# Patient Record
Sex: Male | Born: 1975 | Race: White | Hispanic: No | Marital: Married | Smoking: Former smoker
Health system: Southern US, Community
[De-identification: ages and names within clinical notes are randomized; demographics above are authoritative.]

## PROBLEM LIST (undated history)

## (undated) DIAGNOSIS — J189 Pneumonia, unspecified organism: Secondary | ICD-10-CM

## (undated) DIAGNOSIS — K219 Gastro-esophageal reflux disease without esophagitis: Secondary | ICD-10-CM

## (undated) DIAGNOSIS — N200 Calculus of kidney: Secondary | ICD-10-CM

## (undated) DIAGNOSIS — G4730 Sleep apnea, unspecified: Secondary | ICD-10-CM

## (undated) HISTORY — DX: Sleep apnea, unspecified: G47.30

## (undated) HISTORY — DX: Gastro-esophageal reflux disease without esophagitis: K21.9

## (undated) HISTORY — PX: INGUINAL HERNIA REPAIR: SUR1180

## (undated) HISTORY — DX: Calculus of kidney: N20.0

## (undated) HISTORY — DX: Pneumonia, unspecified organism: J18.9

---

## 1997-12-29 ENCOUNTER — Emergency Department (HOSPITAL_COMMUNITY): Admission: EM | Admit: 1997-12-29 | Discharge: 1997-12-29 | Payer: Self-pay | Admitting: Emergency Medicine

## 1998-07-04 ENCOUNTER — Emergency Department (HOSPITAL_COMMUNITY): Admission: EM | Admit: 1998-07-04 | Discharge: 1998-07-04 | Payer: Self-pay | Admitting: Emergency Medicine

## 2007-07-17 ENCOUNTER — Ambulatory Visit: Payer: Self-pay | Admitting: Gastroenterology

## 2007-08-17 ENCOUNTER — Ambulatory Visit: Payer: Self-pay | Admitting: Gastroenterology

## 2008-08-27 ENCOUNTER — Ambulatory Visit: Payer: Self-pay | Admitting: Surgery

## 2008-09-03 ENCOUNTER — Ambulatory Visit: Payer: Self-pay | Admitting: Surgery

## 2008-09-07 ENCOUNTER — Observation Stay: Payer: Self-pay | Admitting: Internal Medicine

## 2008-10-09 ENCOUNTER — Ambulatory Visit: Payer: Self-pay | Admitting: Surgery

## 2011-08-31 ENCOUNTER — Ambulatory Visit: Payer: Self-pay | Admitting: Otolaryngology

## 2013-02-06 ENCOUNTER — Ambulatory Visit
Admission: RE | Admit: 2013-02-06 | Discharge: 2013-02-06 | Disposition: A | Discharge status: Home / Self Care | Payer: BC Managed Care – PPO | Source: Ambulatory Visit | Attending: Family Medicine | Admitting: Family Medicine

## 2013-02-06 ENCOUNTER — Other Ambulatory Visit: Payer: Self-pay | Admitting: Family Medicine

## 2013-02-06 DIAGNOSIS — M542 Cervicalgia: Secondary | ICD-10-CM

## 2013-02-12 ENCOUNTER — Ambulatory Visit (INDEPENDENT_AMBULATORY_CARE_PROVIDER_SITE_OTHER): Payer: BC Managed Care – PPO | Admitting: Internal Medicine

## 2013-02-12 ENCOUNTER — Encounter: Payer: Self-pay | Admitting: Internal Medicine

## 2013-02-12 VITALS — BP 120/88 | HR 68 | Ht 73.0 in | Wt 160.8 lb

## 2013-02-12 DIAGNOSIS — J392 Other diseases of pharynx: Secondary | ICD-10-CM | POA: Insufficient documentation

## 2013-02-12 DIAGNOSIS — R42 Dizziness and giddiness: Secondary | ICD-10-CM | POA: Insufficient documentation

## 2013-02-12 NOTE — Patient Instructions (Addendum)
Your physician recommends that you schedule a follow-up appointment as needed  

## 2013-02-12 NOTE — Progress Notes (Signed)
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 He recently saw Dr. Aida Puffer for symptoms of some dizziness which is very short-lived. This is also associated with a sharp chest pain and back pain which feels like it comes from his neck. During this episode he gets a little lightheaded and feels it is heart may be a little bit faster, but it resolves quickly.  He apparently underwent a workup and [facility] 4 years ago which made included an echocardiogram and/or stress test. He was told at the time that his "valve was enlarged" and needed to take aspirin but there was little else to do. He also had an EKG performed in the office which was concerning for LVH with strain. He was therefore referred for evaluation of possible congenital aortic disease  PMHx:  GERD  FAMHx:  Father with CAD - MI at age 42.  SOCHx:   reports that he quit smoking about 10 months ago. His smoking use included Cigarettes. He smoked 0.00 packs per day for 20 years. He has quit using smokeless tobacco. He reports that he drinks about 3.0 ounces of alcohol per week. He reports that he does not use illicit drugs.  ALLERGIES:  No Known Allergies  ROS: A comprehensive review of systems was negative except for: Neurological: positive for dizziness  HOME MEDS: Current Outpatient Prescriptions  Medication Sig Dispense Refill  . naproxen (NAPROSYN) 500 MG tablet Take 500 mg by mouth at bedtime.      . OMEPRAZOLE PO Take 40 mg by mouth daily.        No current facility-administered medications for this visit.    LABS/IMAGING: No results found for this or any previous visit (from the past 48 hour(s)). No results found.  VITALS: BP 120/88  Pulse 68  Ht 6\' 1"  (1.854 m)  Wt 160 lb 12.8 oz (72.938 kg)  BMI 21.22 kg/m2  EXAM: General  appearance: alert and no distress Neck: no adenopathy, no carotid bruit, no JVD, supple, symmetrical, trachea midline and thyroid not enlarged, symmetric, no tenderness/mass/nodules Lungs: clear to auscultation bilaterally Heart: regular rate and rhythm, S1, S2 normal, no murmur, click, rub or gallop Abdomen: soft, non-tender; bowel sounds normal; no masses,  no organomegaly Extremities: extremities normal, atraumatic, no cyanosis or edema Pulses: 2+ and symmetric Skin: Skin color, texture, turgor normal. No rashes or lesions Neurologic: Grossly normal  EKG:  normal sinus rhythm at 68, voltages do not meet criteria for LVH  ASSESSMENT: 1.  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   [name] reported a history of some type of valve problem for which she was told that his heart valve was enlarged, but nothing could be done about it. He underwent testing for this 4 years ago in [place] and I would like to obtain those records before subjecting him to additional tests. His exam is benign without any murmur and his EKG does not show any evidence for LVH, or ischemic changes. His symptoms consist of short-lived dizziness and feelings of dry throat and difficulty catching his breath due to a globus-type sensation.  He has had sharp chest wall and back pains which seem to come from his neck and could be related to cervical spine disease. I do not appreciate any cardiac findings.  Will review  any testing he had performed in [place] and if there are abnormalities, may consider repeating an echocardiogram to further evaluate.  Thanks for the referral.  Chrystie Nose, MD, [facility] Attending Cardiologist [facility]  Rayshard Schirtzinger C 02/12/2013, 2:36 PM

## 2013-03-26 ENCOUNTER — Encounter: Payer: Self-pay | Admitting: Internal Medicine

## 2013-04-23 ENCOUNTER — Emergency Department (HOSPITAL_COMMUNITY)
Admission: EM | Admit: 2013-04-23 | Discharge: 2013-04-23 | Disposition: A | Discharge status: Home / Self Care | Payer: BC Managed Care – PPO | Attending: Emergency Medicine | Admitting: Emergency Medicine

## 2013-04-23 ENCOUNTER — Emergency Department (HOSPITAL_COMMUNITY): Payer: BC Managed Care – PPO

## 2013-04-23 ENCOUNTER — Encounter (HOSPITAL_COMMUNITY): Payer: Self-pay | Admitting: Emergency Medicine

## 2013-04-23 DIAGNOSIS — R0602 Shortness of breath: Secondary | ICD-10-CM | POA: Insufficient documentation

## 2013-04-23 DIAGNOSIS — Z7982 Long term (current) use of aspirin: Secondary | ICD-10-CM | POA: Insufficient documentation

## 2013-04-23 DIAGNOSIS — Z87891 Personal history of nicotine dependence: Secondary | ICD-10-CM | POA: Insufficient documentation

## 2013-04-23 DIAGNOSIS — R079 Chest pain, unspecified: Secondary | ICD-10-CM

## 2013-04-23 DIAGNOSIS — R0789 Other chest pain: Secondary | ICD-10-CM | POA: Insufficient documentation

## 2013-04-23 DIAGNOSIS — Z79899 Other long term (current) drug therapy: Secondary | ICD-10-CM | POA: Insufficient documentation

## 2013-04-23 DIAGNOSIS — R209 Unspecified disturbances of skin sensation: Secondary | ICD-10-CM | POA: Insufficient documentation

## 2013-04-23 DIAGNOSIS — K279 Peptic ulcer, site unspecified, unspecified as acute or chronic, without hemorrhage or perforation: Secondary | ICD-10-CM | POA: Insufficient documentation

## 2013-04-23 LAB — CBC
HCT: 42.7 % (ref 39.0–52.0)
Hemoglobin: 14.8 g/dL (ref 13.0–17.0)
MCH: 32.9 pg (ref 26.0–34.0)
MCHC: 34.7 g/dL (ref 30.0–36.0)
MCV: 94.9 fL (ref 78.0–100.0)
Platelets: 215 10*3/uL (ref 150–400)
RDW: 13 % (ref 11.5–15.5)
WBC: 5.3 10*3/uL (ref 4.0–10.5)

## 2013-04-23 LAB — BASIC METABOLIC PANEL
BUN: 9 mg/dL (ref 6–23)
CO2: 28 mEq/L (ref 19–32)
Calcium: 9.5 mg/dL (ref 8.4–10.5)
Chloride: 103 mEq/L (ref 96–112)
Creatinine, Ser: 1.08 mg/dL (ref 0.50–1.35)
GFR calc Af Amer: >90 mL/min (ref >90)
GFR calc non Af Amer: 86 mL/min — ABNORMAL LOW (ref >90)

## 2013-04-23 LAB — PRO B NATRIURETIC PEPTIDE: Pro B Natriuretic peptide (BNP): 96.7 pg/mL (ref 0–125)

## 2013-04-23 LAB — D-DIMER, QUANTITATIVE: D-Dimer, Quant: 0.29 ug{FEU}/mL (ref 0.00–0.48)

## 2013-04-23 LAB — POCT I-STAT TROPONIN I: Troponin i, poc: 0 ng/mL (ref 0.00–0.08)

## 2013-04-23 LAB — TROPONIN I: Troponin I: <0.3 ng/mL (ref <0.30)

## 2013-04-23 MED ORDER — FAMOTIDINE 20 MG PO TABS
20.0000 mg | ORAL_TABLET | Freq: Once | ORAL | Status: AC
  Administered 2013-04-23: 20 mg via ORAL
  Filled 2013-04-23: qty 1

## 2013-04-23 MED ORDER — RANITIDINE HCL 150 MG PO TABS: 150.0000 mg | ORAL_TABLET | Freq: Two times a day (BID) | ORAL | Status: DC

## 2013-04-23 MED ORDER — NITROGLYCERIN 0.4 MG SL SUBL
0.4000 mg | SUBLINGUAL_TABLET | SUBLINGUAL | Status: DC | PRN
  Administered 2013-04-23: 0.4 mg via SUBLINGUAL

## 2013-04-23 MED ORDER — GI COCKTAIL ~~LOC~~
30.0000 mL | Freq: Once | ORAL | Status: AC
  Administered 2013-04-23: 30 mL via ORAL
  Filled 2013-04-23: qty 30

## 2013-04-23 NOTE — ED Notes (Signed)
Pt sent here from pcp. Reports having back pain and sob for extended amount of time but now also having left side chest burning and numbness sensation to left arm for several days. ekg done at triage, airwayintact.

## 2013-04-23 NOTE — ED Notes (Signed)
MD at bedside. 

## 2013-04-23 NOTE — ED Provider Notes (Signed)
CSN: [id number]     Arrival date & time 04/23/13  1439 History   First MD Initiated Contact with Patient 04/23/13 1612     Chief Complaint  Patient presents with  . Chest Pain  . Shortness of Breath   (Consider location/radiation/quality/duration/timing/severity/associated sxs/prior Treatment) HPI Comments: Pt comes in with cc of chest pain. Pt has no medical hx. States that for the past 2 months, he has been having some chest discomfort and dib. He has been to Urgent care, with neg workup. Last night he started having constant, burning type chest pain on the left side, that is radiating in between the shoulder blade area. The pain is constant, and has no specific aggravating or relieving factors. There is no n/v/f/c/diophoresis - pt does have some dyspnea. No hx of PE, DVT and no risk factors for the same. Pt has numbness in his Left hand and arm - denies any neck pain or trauma.   Patient is a 37 y.o. male presenting with chest pain and shortness of breath. The history is provided by the patient.  Chest Pain Associated symptoms: numbness and shortness of breath   Associated symptoms: no cough, no dizziness, no fever and no headache   Shortness of Breath Associated symptoms: chest pain   Associated symptoms: no cough, no fever, no headaches and no neck pain     History reviewed. No pertinent past medical history. History reviewed. No pertinent past surgical history. History reviewed. No pertinent family history. History  Substance Use Topics  . Smoking status: Former Smoker -- 20 years    Types: Cigarettes    Quit date: 03/20/2012  . Smokeless tobacco: Former Neurosurgeon  . Alcohol Use: 3.0 oz/week    6 drink(s) per week     Comment: occasional     Review of Systems  Constitutional: Negative for fever, chills and activity change.  Eyes: Negative for visual disturbance.  Respiratory: Positive for shortness of breath. Negative for cough and chest tightness.   Cardiovascular: Positive  for chest pain.  Gastrointestinal: Negative for abdominal distention.  Genitourinary: Negative for dysuria, enuresis and difficulty urinating.  Musculoskeletal: Negative for arthralgias and neck pain.  Neurological: Positive for numbness. Negative for dizziness, light-headedness and headaches.  Psychiatric/Behavioral: Negative for confusion.    Allergies  Review of patient's allergies indicates no known allergies.  Home Medications   Current Outpatient Rx  Name  Route  Sig  Dispense  Refill  . aspirin 325 MG tablet   Oral   Take 325 mg by mouth 2 (two) times daily.         . Menthol-Methyl Salicylate (MUSCLE RUB) 10-15 % CREA   Topical   Apply 1 application topically daily as needed for muscle pain.         Marland Kitchen OMEPRAZOLE PO   Oral   Take 40 mg by mouth 2 (two) times daily.           BP 109/72  Pulse 89  Temp(Src) 97.8 F (36.6 C) (Oral)  Resp 18  Ht 6\' 1"  (1.854 m)  Wt 166 lb 12.8 oz (75.66 kg)  BMI 22.01 kg/m2  SpO2 97% Physical Exam  Nursing note and vitals reviewed. Constitutional: He is oriented to person, place, and time. He appears well-developed.  HENT:  Head: Normocephalic and atraumatic.  Eyes: Conjunctivae and EOM are normal. Pupils are equal, round, and reactive to light.  Neck: Normal range of motion. Neck supple.  Cardiovascular: Normal rate, regular rhythm and intact distal pulses.  Exam reveals no gallop and no friction rub.   No murmur heard. Pulmonary/Chest: Effort normal and breath sounds normal.  Abdominal: Soft. Bowel sounds are normal. He exhibits no distension. There is no tenderness. There is no rebound and no guarding.  Neurological: He is alert and oriented to person, place, and time.  Skin: Skin is warm.    ED Course  Procedures (including critical care time) Labs Review Labs Reviewed  BASIC METABOLIC PANEL - Abnormal; Notable for the following:    GFR calc non Af Amer 86 (*)    All other components within normal limits  CBC   PRO B NATRIURETIC PEPTIDE  D-DIMER, QUANTITATIVE  POCT I-STAT TROPONIN I   Imaging Review Dg Chest 2 View  04/23/2013   CLINICAL DATA:  Chest pain and shortness of breath  EXAM: CHEST  2 VIEW  COMPARISON:  None.  FINDINGS: The heart and pulmonary vascularity are within normal limits. The lungs are mildly hyperinflated. No focal infiltrate or sizable effusion is seen. No bony abnormality is noted.  IMPRESSION: No active cardiopulmonary disease.   Electronically Signed   By: Alcide Clever M.D.   On: 04/23/2013 15:21    EKG Interpretation     Ventricular Rate:  81 PR Interval:  146 QRS Duration: 78 QT Interval:  342 QTC Calculation: 397 R Axis:   80 Text Interpretation:  Sinus rhythm with marked sinus arrhythmia Septal infarct , age undetermined Abnormal ECG            MDM  No diagnosis found. Differential diagnosis includes: ACS syndrome CHF exacerbation Dissection Valvular disorder Myocarditis Pericarditis Pericardial effusion Pneumonia Pleural effusion Pulmonary edema PE Anemia Musculoskeletal pain  Pt comes in with cc of chest pain. Pain is typical sounding, left sided, radiating to the back with some numbness in his arm. The pain quality is atypical - in that it is burning. Pt has 0 cardiac risk factors (father in 51s had MI). No risk factors for dissection either, and the pulses are intact and equal. Trop x 2, EKG and dimer are neg.   Doubt ACS, PE, Dissection - but we will give Cards follow up.  Pt had no abd tenderness on the exam. GI cocktail did help. I am wondering if there is any esophageal spasm causing the pain, or PUD. We will get him a GI f.u, He has insurance and a PCP, and is reliable.  Return precautions have been discussed.  Pt's abd exam is benign,  Derwood Kaplan, MD 04/23/13 2106

## 2013-04-24 ENCOUNTER — Encounter: Payer: Self-pay | Admitting: Gastroenterology

## 2013-04-25 ENCOUNTER — Encounter: Payer: Self-pay | Admitting: Internal Medicine

## 2013-04-25 ENCOUNTER — Ambulatory Visit (INDEPENDENT_AMBULATORY_CARE_PROVIDER_SITE_OTHER): Payer: BC Managed Care – PPO | Admitting: Internal Medicine

## 2013-04-25 VITALS — BP 136/84 | HR 63 | Temp 97.7°F | Ht 73.0 in | Wt 168.4 lb

## 2013-04-25 DIAGNOSIS — R0600 Dyspnea, unspecified: Secondary | ICD-10-CM

## 2013-04-25 DIAGNOSIS — R0609 Other forms of dyspnea: Secondary | ICD-10-CM

## 2013-04-25 DIAGNOSIS — R42 Dizziness and giddiness: Secondary | ICD-10-CM

## 2013-04-25 MED ORDER — RANITIDINE HCL 150 MG PO TABS: ORAL_TABLET | ORAL | Status: DC

## 2013-04-25 NOTE — Progress Notes (Signed)
Subjective:    Patient ID: Jose Schmidt, male    DOB: [birth year]   MRN: [id number]  HPI  37 yowm quit smoking 03/2012 with no problems with sob  Referred 04/25/13 to pulmonary clinic by Dr Burnell Blanks for unexplained SOB since sept 2014.    04/25/2013 [facility] office visit/ [name] cc indolent onset of worsening globus sensation x 2 months  in the setting of chronic use ppi once daily  initially treated with  Double dose of ppi to bid ac.  Main problem breathing occurs with talking to point of hoarseness / strangling.  ent eval neg but suggested lpr, no better with albuterol.  ppi helps globus but not sob, though both came on at the same time. eval in ER 04/23/13 with neg cardiac w/u, referred to cards and added zantac bid to ppi  Then about one month prior to OV noted indolent onset of post  Upper neck pain with talking  Using lots of cough drops for mostly am coughing that does not wake him and is dry.  No obvious day to day or daytime variabilty or assoc chronic cough or cp or chest tightness, subjective wheeze overt sinus or hb symptoms. No unusual exp hx or h/o childhood pna/ asthma or knowledge of premature birth.  Sleeping ok without nocturnal  or early am exacerbation  of respiratory  c/o's or need for noct saba. Also denies any obvious fluctuation of symptoms with weather or environmental changes or other aggravating or alleviating factors except as outlined above   Current Medications, Allergies, Complete Past Medical History, Past Surgical History, Family History, and Social History were reviewed in [employer] [facility] record.  ROS  The following are not active complaints unless bolded sore throat, dysphagia, dental problems, itching, sneezing,  nasal congestion or excess/ purulent secretions, ear ache,   fever, chills, sweats, unintended wt loss, pleuritic or exertional cp, hemoptysis,  orthopnea pnd or leg swelling, presyncope, palpitations, heartburn,  abdominal pain, anorexia, nausea, vomiting, diarrhea  or change in bowel or urinary habits, change in stools or urine, dysuria,hematuria,  rash, arthralgias, visual complaints, headache, numbness weakness or ataxia or problems with walking or coordination,  change in mood/affect or memory.       Review of Systems  Constitutional: Negative for fever, chills, activity change, appetite change and unexpected weight change.  HENT: Positive for trouble swallowing. Negative for congestion, dental problem, postnasal drip, rhinorrhea, sneezing, sore throat and voice change.   Eyes: Negative for visual disturbance.  Respiratory: Positive for cough and shortness of breath. Negative for choking.   Cardiovascular: Positive for chest pain. Negative for leg swelling.  Gastrointestinal: Positive for nausea. Negative for vomiting and abdominal pain.  Genitourinary: Negative for difficulty urinating.  Musculoskeletal: Positive for arthralgias.  Skin: Negative for rash.  Psychiatric/Behavioral: Negative for behavioral problems and confusion.       Objective:   Physical Exam  amb wm nad   Wt Readings from Last 3 Encounters:  04/25/13 168 lb 6.4 oz (76.386 kg)  04/23/13 166 lb 12.8 oz (75.66 kg)  02/12/13 160 lb 12.8 oz (72.938 kg)      HEENT: nl dentition, turbinates, and orophanx. Nl external ear canals without cough reflex   NECK :  without JVD/Nodes/TM/ nl carotid upstrokes bilaterally   LUNGS: no acc muscle use, clear to A and P bilaterally without cough on insp or exp maneuvers   CV:  RRR  no s3 or murmur or increase in P2, no  edema   ABD:  soft and nontender with nl excursion in the supine position. No bruits or organomegaly, bowel sounds nl  MS:  warm without deformities, calf tenderness, cyanosis or clubbing  SKIN: warm and dry without lesions    NEURO:  alert, approp, no deficits    cxr 04/23/13 No active cardiopulmonary disease.     Assessment & Plan:

## 2013-04-25 NOTE — Patient Instructions (Signed)
Most likely your problem is related to reflux and you need to keep your appt with GI  In meantime I recommend you try the following:  Change the Zantac to just takethe 150 x 2 at bedtime until you see GI  GERD (REFLUX)  is an extremely common cause of respiratory symptoms, many times with no significant heartburn at all.    It can be treated with medication, but also with lifestyle changes including avoidance of late meals, excessive alcohol, smoking cessation, and avoid fatty foods, chocolate, peppermint, colas, red wine, and acidic juices such as orange juice.  NO MINT OR MENTHOL PRODUCTS SO NO COUGH DROPS  USE SUGARLESS CANDY INSTEAD (jolley ranchers or Stover's)  NO OIL BASED VITAMINS - use powdered substitutes.

## 2013-04-27 DIAGNOSIS — R0600 Dyspnea, unspecified: Secondary | ICD-10-CM | POA: Insufficient documentation

## 2013-04-27 NOTE — Assessment & Plan Note (Addendum)
04/25/2013  Walked RA x 3 laps @ 185 ft each stopped due to  End of study no desat  Symptoms are markedly disproportionate to objective findings and not clear this is a lung problem but pt does appear to have difficult airway management issues.  ? Acid (or non-acid) GERD > always difficult to exclude as up to 75% of pts in some series report no assoc GI/ Heartburn symptoms and note his initial symptoms started with classic globus which was better with acid suppression which does nothing for the actual LES dysfunction assoc with GERD > rec continue max (24h)  acid suppression and diet restrictions/ reviewed and instructions given in writting (no cough drops)  ? Anxiety > dx of exclusion   ? Chf/ cardiac > strongly doubt "angina equivalent" but if not improving probably needs cpst rather than just a cardiac w/u given that er eval for ischemia was neg 04/23/13

## 2013-05-22 ENCOUNTER — Ambulatory Visit: Payer: BC Managed Care – PPO | Admitting: Gastroenterology

## 2015-12-08 DIAGNOSIS — N433 Hydrocele, unspecified: Secondary | ICD-10-CM | POA: Diagnosis not present

## 2015-12-14 DIAGNOSIS — K4020 Bilateral inguinal hernia, without obstruction or gangrene, not specified as recurrent: Secondary | ICD-10-CM | POA: Diagnosis not present

## 2015-12-14 DIAGNOSIS — R1031 Right lower quadrant pain: Secondary | ICD-10-CM | POA: Diagnosis not present

## 2015-12-14 DIAGNOSIS — K219 Gastro-esophageal reflux disease without esophagitis: Secondary | ICD-10-CM | POA: Diagnosis not present

## 2015-12-14 DIAGNOSIS — Z6822 Body mass index (BMI) 22.0-22.9, adult: Secondary | ICD-10-CM | POA: Diagnosis not present

## 2016-01-01 DIAGNOSIS — K4020 Bilateral inguinal hernia, without obstruction or gangrene, not specified as recurrent: Secondary | ICD-10-CM | POA: Diagnosis not present

## 2016-01-01 DIAGNOSIS — R1031 Right lower quadrant pain: Secondary | ICD-10-CM | POA: Diagnosis not present

## 2016-05-17 DIAGNOSIS — J189 Pneumonia, unspecified organism: Secondary | ICD-10-CM | POA: Diagnosis not present

## 2016-06-30 DIAGNOSIS — J918 Pleural effusion in other conditions classified elsewhere: Secondary | ICD-10-CM | POA: Diagnosis not present

## 2016-07-05 DIAGNOSIS — K219 Gastro-esophageal reflux disease without esophagitis: Secondary | ICD-10-CM | POA: Diagnosis not present

## 2016-07-05 DIAGNOSIS — R112 Nausea with vomiting, unspecified: Secondary | ICD-10-CM | POA: Diagnosis not present

## 2016-07-05 DIAGNOSIS — R0989 Other specified symptoms and signs involving the circulatory and respiratory systems: Secondary | ICD-10-CM | POA: Diagnosis not present

## 2016-07-05 DIAGNOSIS — R05 Cough: Secondary | ICD-10-CM | POA: Diagnosis not present

## 2016-07-05 DIAGNOSIS — Z87891 Personal history of nicotine dependence: Secondary | ICD-10-CM | POA: Diagnosis not present

## 2016-07-05 DIAGNOSIS — R42 Dizziness and giddiness: Secondary | ICD-10-CM | POA: Diagnosis not present

## 2016-11-03 DIAGNOSIS — D485 Neoplasm of uncertain behavior of skin: Secondary | ICD-10-CM | POA: Diagnosis not present

## 2016-11-03 DIAGNOSIS — D225 Melanocytic nevi of trunk: Secondary | ICD-10-CM | POA: Diagnosis not present

## 2016-11-03 DIAGNOSIS — L814 Other melanin hyperpigmentation: Secondary | ICD-10-CM | POA: Diagnosis not present

## 2016-11-04 ENCOUNTER — Ambulatory Visit (INDEPENDENT_AMBULATORY_CARE_PROVIDER_SITE_OTHER): Payer: BLUE CROSS/BLUE SHIELD

## 2016-11-04 ENCOUNTER — Encounter: Payer: Self-pay | Admitting: Family Medicine

## 2016-11-04 ENCOUNTER — Encounter (INDEPENDENT_AMBULATORY_CARE_PROVIDER_SITE_OTHER): Payer: Self-pay

## 2016-11-04 ENCOUNTER — Ambulatory Visit (INDEPENDENT_AMBULATORY_CARE_PROVIDER_SITE_OTHER): Payer: BLUE CROSS/BLUE SHIELD | Admitting: Family Medicine

## 2016-11-04 ENCOUNTER — Telehealth: Payer: Self-pay | Admitting: *Deleted

## 2016-11-04 VITALS — BP 120/80 | HR 69 | Temp 97.8°F | Ht 72.0 in | Wt 164.1 lb

## 2016-11-04 DIAGNOSIS — Z130 Encounter for screening for diseases of the blood and blood-forming organs and certain disorders involving the immune mechanism: Secondary | ICD-10-CM

## 2016-11-04 DIAGNOSIS — Z13220 Encounter for screening for lipoid disorders: Secondary | ICD-10-CM

## 2016-11-04 DIAGNOSIS — J449 Chronic obstructive pulmonary disease, unspecified: Secondary | ICD-10-CM | POA: Diagnosis not present

## 2016-11-04 DIAGNOSIS — R0781 Pleurodynia: Secondary | ICD-10-CM

## 2016-11-04 LAB — LIPID PANEL
CHOLESTEROL: 163 mg/dL (ref 0–200)
HDL: 58.4 mg/dL (ref >39.00)
LDL Cholesterol: 90 mg/dL (ref 0–99)
NONHDL: 104.3
Total CHOL/HDL Ratio: 3
Triglycerides: 71 mg/dL (ref 0.0–149.0)
VLDL: 14.2 mg/dL (ref 0.0–40.0)

## 2016-11-04 LAB — COMPREHENSIVE METABOLIC PANEL
ALBUMIN: 4.6 g/dL (ref 3.5–5.2)
ALT: 13 U/L (ref 0–53)
AST: 19 U/L (ref 0–37)
Alkaline Phosphatase: 53 U/L (ref 39–117)
BILIRUBIN TOTAL: 0.4 mg/dL (ref 0.2–1.2)
BUN: 9 mg/dL (ref 6–23)
CO2: 29 mEq/L (ref 19–32)
Calcium: 9.4 mg/dL (ref 8.4–10.5)
Chloride: 104 mEq/L (ref 96–112)
Creatinine, Ser: 0.99 mg/dL (ref 0.40–1.50)
GFR: 88.6 mL/min (ref >60.00)
GLUCOSE: 87 mg/dL (ref 70–99)
Potassium: 3.7 mEq/L (ref 3.5–5.1)
Sodium: 139 mEq/L (ref 135–145)
Total Protein: 7.2 g/dL (ref 6.0–8.3)

## 2016-11-04 LAB — CBC
HCT: 42.6 % (ref 39.0–52.0)
HEMOGLOBIN: 14.3 g/dL (ref 13.0–17.0)
MCHC: 33.7 g/dL (ref 30.0–36.0)
MCV: 92.6 fl (ref 78.0–100.0)
PLATELETS: 238 10*3/uL (ref 150.0–400.0)
RBC: 4.6 Mil/uL (ref 4.22–5.81)
RDW: 13.1 % (ref 11.5–15.5)
WBC: 6 10*3/uL (ref 4.0–10.5)

## 2016-11-04 IMAGING — DX DG CHEST 2V
2 series · 2 of 2 positions shown · non-contrast
Comparison: [DATE]

CLINICAL DATA: Pleuritic chest pain

EXAM:
CHEST  2 VIEW

[chest pa]
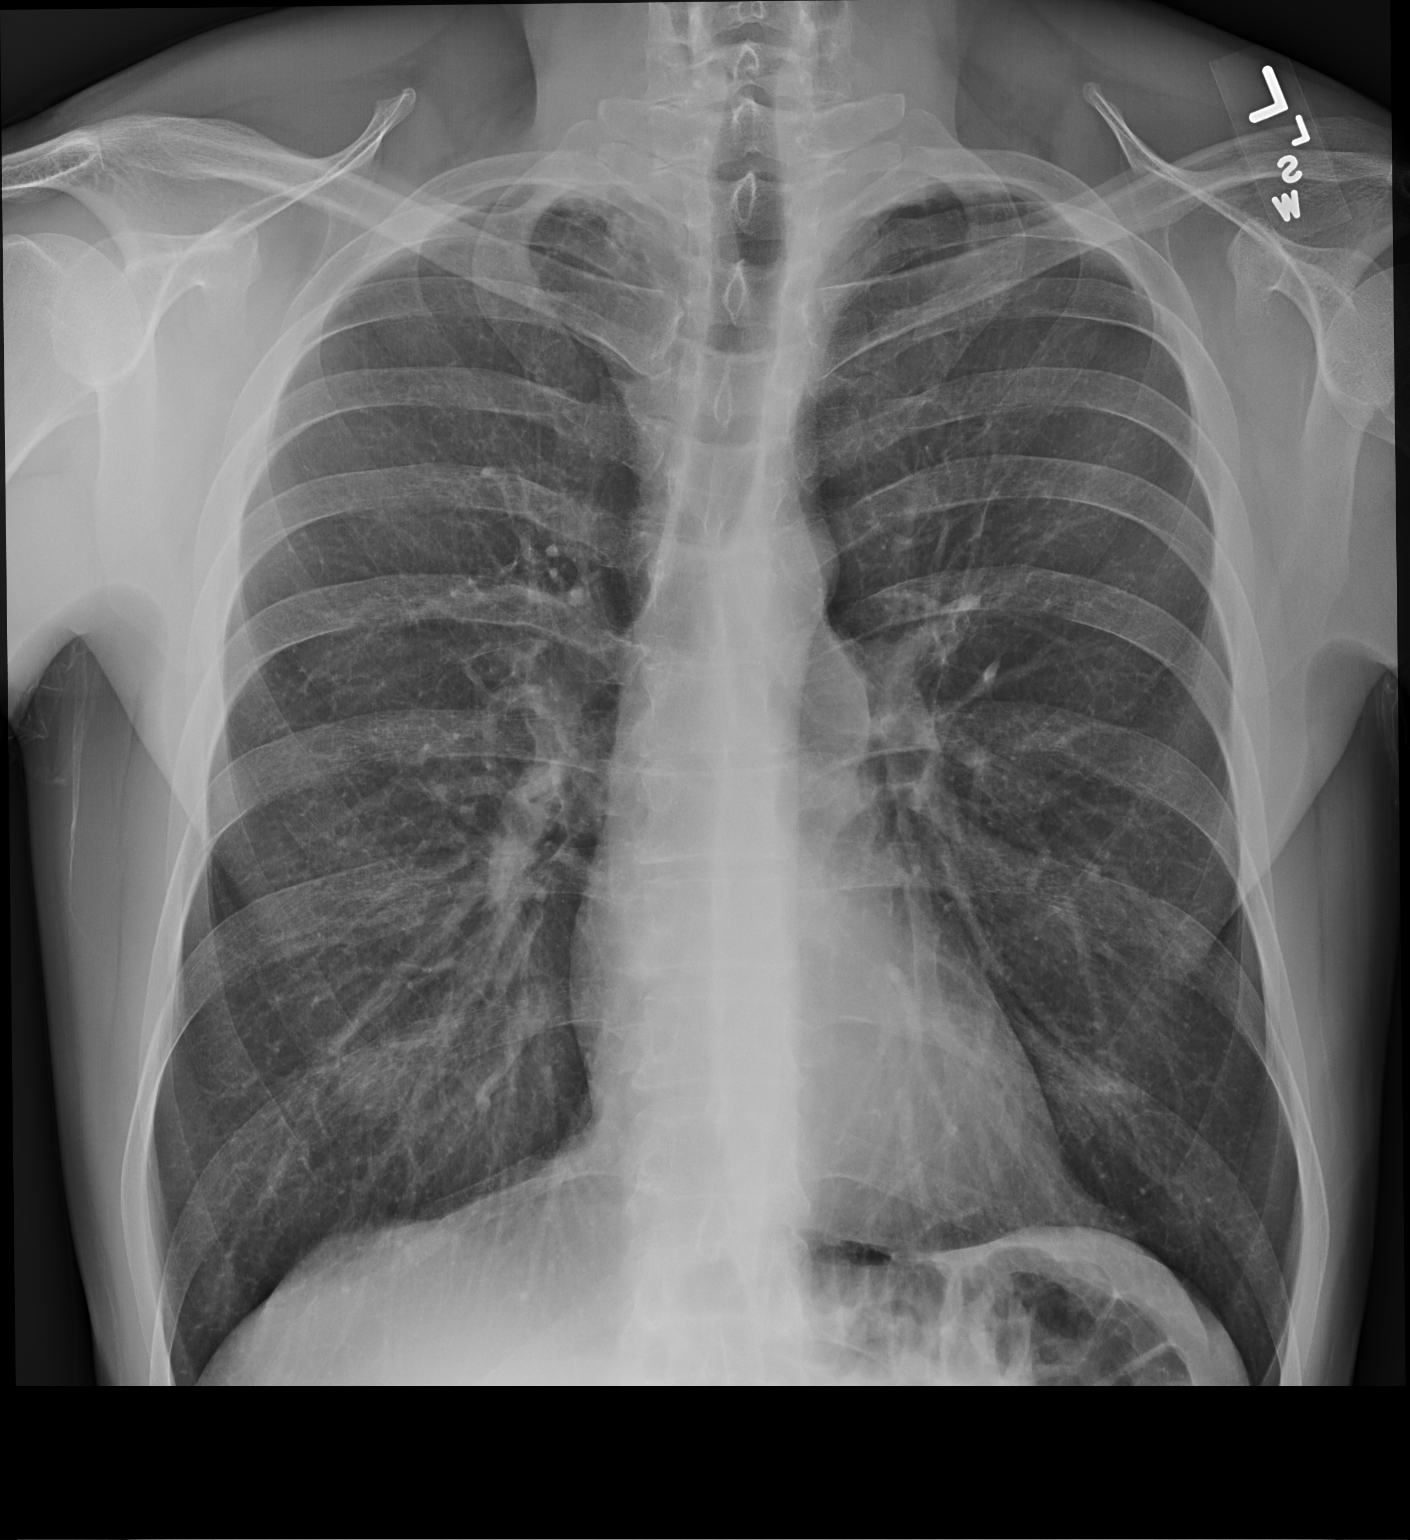

[chest lat]
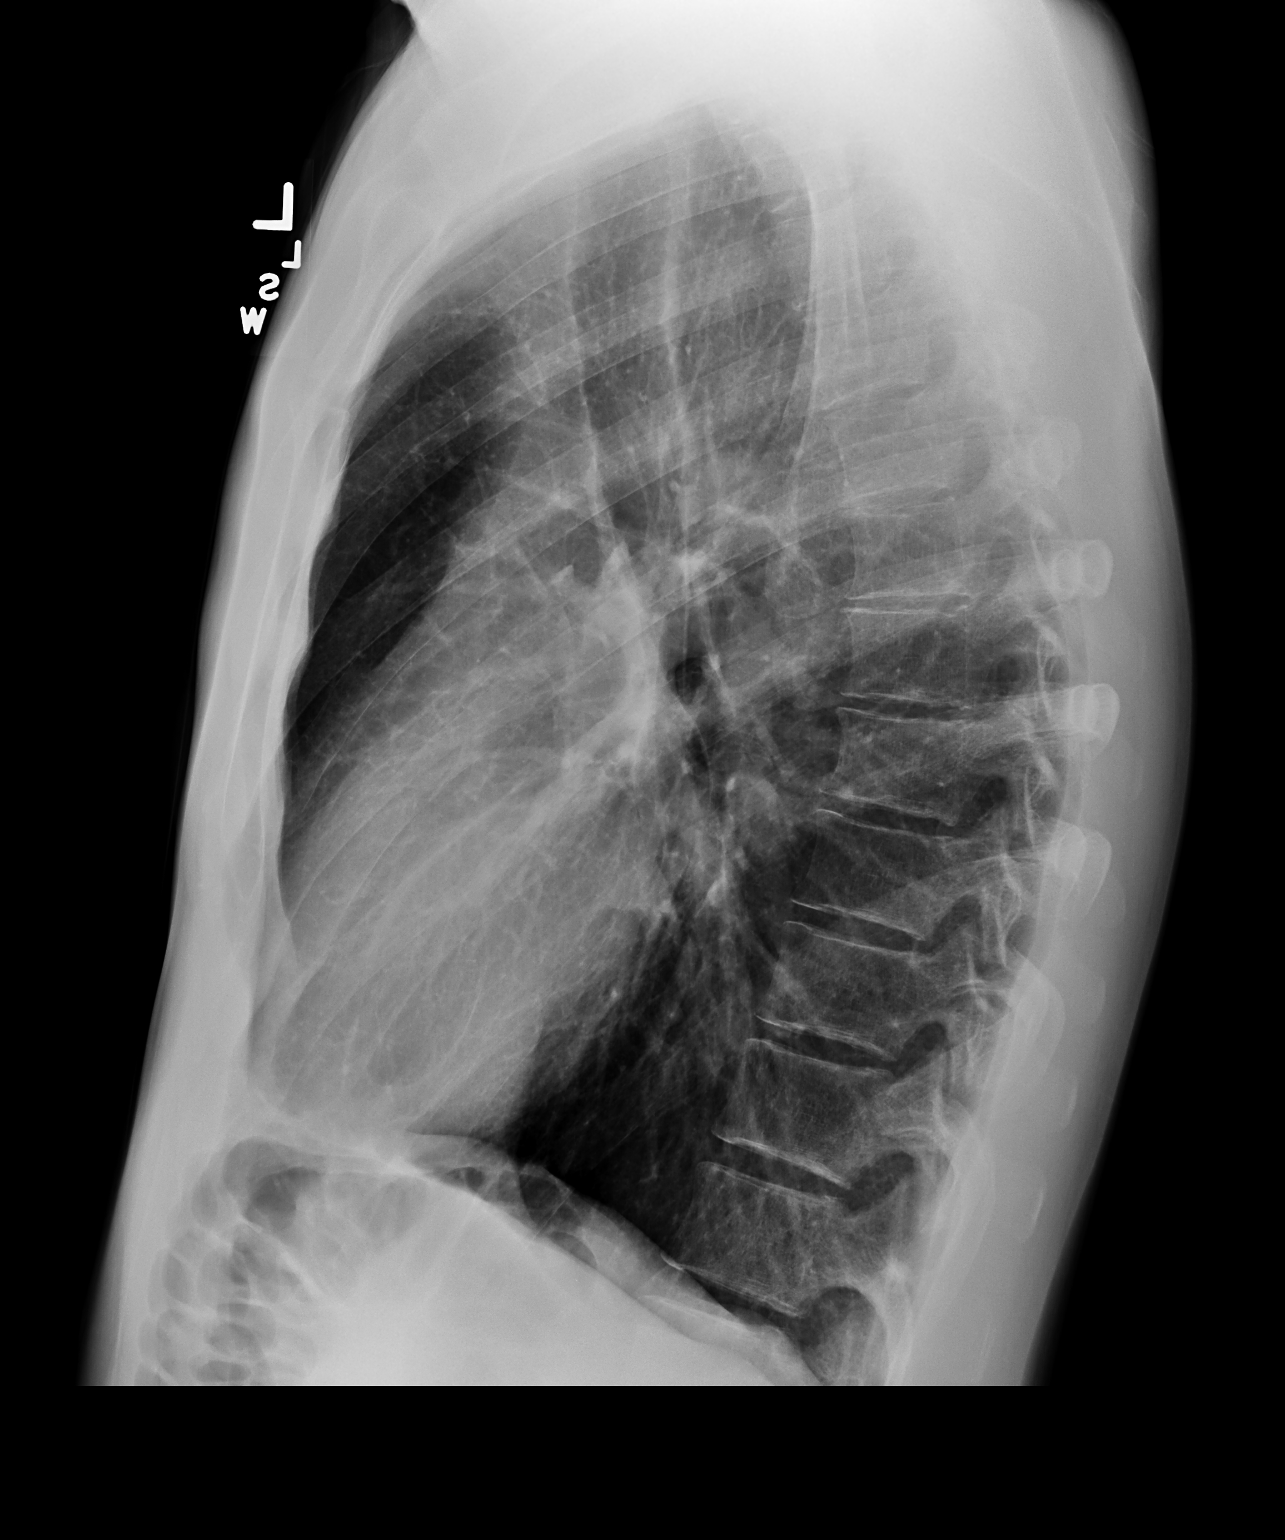

[2 of 2 positions shown; findings below may reference images not displayed]

FINDINGS: Lungs are hyperinflated consistent with COPD. Cardiac shadow is
within normal limits. The bony structures are within normal limits.
IMPRESSION: COPD without acute abnormality.

## 2016-11-04 NOTE — Assessment & Plan Note (Signed)
New problem. Exam unremarkable. I suspect this is musculoskeletal in origin. Patient is worried about recurrence of pneumonia and also about lung cancer even and he is a former smoker. Obtaining x-ray of today for further evaluation. Advised supportive care. Over-the-counter anti-inflammatories and heat.

## 2016-11-04 NOTE — Telephone Encounter (Signed)
Please advise, thanks.

## 2016-11-04 NOTE — Telephone Encounter (Signed)
Left voice mail to call back 

## 2016-11-04 NOTE — Patient Instructions (Signed)
We will call with the results.   Follow up annually.  Take care  Dr. Carmen Vallecillo 

## 2016-11-04 NOTE — Progress Notes (Signed)
Subjective:  Patient ID: Jose Schmidt, male    DOB: February 21, 1976  Age: 41 y.o. MRN: [id number]  CC: Pain with breathing, prior pneumonia  HPI Jose Schmidt is a 41 y.o. male presents to the clinic today with the above complaints.  Patient reports that he had community-acquired pneumonia in December. He was treated and had improvement. However, he developed pleuritic pain afterwards and was diagnosed with pleurisy. He was treated and subsequently did well. He states that for the past 3 weeks she's had intermittent pain particularly of the right ribs. Worse with breathing. No associated cough. No fever. No known relieving factors. Moderate in severity. It was particularly bad yesterday. He has no pain today. No other associated symptoms. No other complaints or concerns at this time.  PMH, Surgical Hx, Family Hx, Social History reviewed and updated as below.  Past Medical History:  Diagnosis Date  . GERD (gastroesophageal reflux disease)    Past Surgical History:  Procedure Laterality Date  . INGUINAL HERNIA REPAIR     Bilateral    Family History  Problem Relation Age of Onset  . Heart attack Father   . Deep vein thrombosis Father   . Hyperlipidemia Father   . Hypertension Father   . Heart attack Maternal Grandfather   . Lung cancer Paternal Grandfather        ? if he smoked or not  . Hyperlipidemia Mother   . Hypertension Mother   . Hypertension Maternal Grandmother    Social History  Substance Use Topics  . Smoking status: Former Smoker    Packs/day: 0.50    Years: 20.00    Types: Cigarettes    Quit date: 03/20/2012  . Smokeless tobacco: Former Neurosurgeon  . Alcohol use 3.0 oz/week    6 Standard drinks or equivalent per week     Comment: occasional     Review of Systems  Respiratory:       Pleuritic pain.  All other systems reviewed and are negative.   Objective:   Today's Vitals: BP 120/80   Pulse 69   Temp 97.8 F (36.6 C) (Oral)   Ht 6' (1.829 m)   Wt  164 lb 2 oz (74.4 kg)   SpO2 97%   BMI 22.26 kg/m   Physical Exam  Constitutional: He is oriented to person, place, and time. He appears well-developed and well-nourished. No distress.  HENT:  Head: Normocephalic and atraumatic.  Nose: Nose normal.  Mouth/Throat: Oropharynx is clear and moist. No oropharyngeal exudate.  Eyes: Conjunctivae are normal. No scleral icterus.  Neck: Neck supple.  Cardiovascular: Normal rate and regular rhythm.   No murmur heard. Pulmonary/Chest: Effort normal and breath sounds normal. He has no wheezes. He has no rales.  Abdominal: Soft. He exhibits no distension. There is no tenderness. There is no rebound and no guarding.  Musculoskeletal: Normal range of motion. He exhibits no edema.  Lymphadenopathy:    He has no cervical adenopathy.  Neurological: He is alert and oriented to person, place, and time.  Skin: Skin is warm and dry. No rash noted.  Psychiatric: He has a normal mood and affect.  Vitals reviewed.  Assessment & Plan:   Problem List Items Addressed This Visit    Pleuritic pain - Primary    New problem. Exam unremarkable. I suspect this is musculoskeletal in origin. Patient is worried about recurrence of pneumonia and also about lung cancer even and he is a former smoker. Obtaining x-ray of today for  further evaluation. Advised supportive care. Over-the-counter anti-inflammatories and heat.      Relevant Orders   DG Chest 2 View   Comprehensive metabolic panel    Other Visit Diagnoses    Screening for deficiency anemia       Relevant Orders   CBC   Screening, lipid       Relevant Orders   Lipid panel     Follow-up: Annually  Everlene OtherJayce Nihar Klus DO [facility] [place]

## 2016-11-04 NOTE — Telephone Encounter (Signed)
Patient had more questions in regards to lab results Pt contact [phone]

## 2016-11-07 NOTE — Telephone Encounter (Signed)
Spoke with patient what Xray labs showed and it stated mild copd. Patient wants to know if he has COPD and also what treatment he needs to start. Also had questions about his dx for his pain. Please advise.

## 2016-11-07 NOTE — Telephone Encounter (Signed)
Please call pt at [phone]

## 2016-11-07 NOTE — Telephone Encounter (Signed)
Xray findings were consistent with COPD. Diagnosis of COPD is made with PFT's. No need for treatment in the absence of symptoms. OTC NSAID's for pain.

## 2016-11-08 NOTE — Telephone Encounter (Signed)
Pt.notified

## 2016-11-09 ENCOUNTER — Encounter: Payer: Self-pay | Admitting: Family Medicine

## 2016-11-09 ENCOUNTER — Encounter: Payer: Self-pay | Admitting: *Deleted

## 2016-11-09 ENCOUNTER — Other Ambulatory Visit: Payer: Self-pay | Admitting: Family Medicine

## 2016-11-09 MED ORDER — PREDNISONE 10 MG (21) PO TBPK: ORAL_TABLET | ORAL | 0 refills | Status: DC

## 2016-11-09 MED ORDER — CYCLOBENZAPRINE HCL 10 MG PO TABS: 10.0000 mg | ORAL_TABLET | Freq: Three times a day (TID) | ORAL | 0 refills | Status: DC | PRN

## 2016-11-23 DIAGNOSIS — J050 Acute obstructive laryngitis [croup]: Secondary | ICD-10-CM | POA: Diagnosis not present

## 2016-11-23 DIAGNOSIS — J329 Chronic sinusitis, unspecified: Secondary | ICD-10-CM | POA: Diagnosis not present

## 2016-12-04 NOTE — Progress Notes (Addendum)
[facility] [place] [facility] Medicine Consultation      Assessment and Plan:  Chest pain -Recent pneumonia, with chest pain since that time. Uncertain etiology. No symptoms of pleurisy. -We'll send for CT chest with contrast/CT angioma.  Asthma.  -Uncertain if these symptoms were present, asthmatic equivalent. -I've given him a empiric steroid inhaler to use for the next week (Arnuity). -He is asked that if this helps. He is to call us back and let us know, we will then call in a prescription.  Reflux/Gerd.  -He has a history of severe reflux, controlled on Nexium twice a day. -He underwent esophageal stretching approximate 5 years ago, he does not feel that this is worsened since that time. -. However, given his recent pulmonary symptoms, we'll refer him back to be followed up with gastroenterology.  Addendum: CT chest reviewed, normal other than some mild areas of emphysematous changes noted. This comes with patient. Will send for full PFT, an alpha-1 screening.  Date: 12/04/2016  MRN# [id number] Mayo Ao [birth year]   Mayo Ao is a 41 y.o. old male seen in consultation for chief complaint of:    Chief Complaint  Patient presents with  . Advice Only    ref by Adriana Simas: burning feeling in lungs: cough, prod at times    HPI:   The patient recently underwent an evaluation for pleuritic chest pain, which included a chest radiograph. Apparently this was read as hyperinflation consistent with emphysema.  He has reacently had bouts of pneumonia and laryngitis, he notes a burning sensation in his right lung posteriorly. He feels that it occurs every few days. This has been present since a bout of pneumonia which occurred in December of last year, he went to urgent care, he got an abx shot, he had a CXr at that time, he was told that he had a pneumonia.  He went back a 2nd time 1.5 months later for continued symptoms, he underwent another CXR which looked normal.  He continued  to have a right sided chest burning, this is occasionally made worse with anxiety.  He takes 2 nexium daily for several years.  He has had his esophagus stretched about 5 years ago. Back then he was having a lot of indigestion. He used to have trouble swallowing until then, now he not trouble swallowing. He has no further symptoms as long as he takes nexium.   He notes that his breathing is normal. Occasionally he feels that he catches his breaths. He went back to urgent care recently with laryngitis and got a course of abx, steroids.   He last smoked about 5 years ago. No one at home smokes, he has a inside cat, occasionally in bedroom.   I personally reviewed. Chest x-ray images, there is some degree of hyperinflation, though this appears to be normal for age, and does not necessarily indicate emphysema.   PMHX:   Past Medical History:  Diagnosis Date  . GERD (gastroesophageal reflux disease)    Surgical Hx:  Past Surgical History:  Procedure Laterality Date  . INGUINAL HERNIA REPAIR     Bilateral   Family Hx:  Family History  Problem Relation Age of Onset  . Heart attack Father   . Deep vein thrombosis Father   . Hyperlipidemia Father   . Hypertension Father   . Heart attack Maternal Grandfather   . Lung cancer Paternal Grandfather        ? if he smoked or not  . Hyperlipidemia Mother   .  Hypertension Mother   . Hypertension Maternal Grandmother    Social Hx:   Social History  Substance Use Topics  . Smoking status: Former Smoker    Packs/day: 0.50    Years: 20.00    Types: Cigarettes    Quit date: 03/20/2012  . Smokeless tobacco: Former NeurosurgeonUser  . Alcohol use 3.0 oz/week    6 Standard drinks or equivalent per week     Comment: occasional    Medication:    Current Outpatient Prescriptions:  .  aspirin 81 MG chewable tablet, Chew 81 mg by mouth., Disp: , Rfl:  .  cyclobenzaprine (FLEXERIL) 10 MG tablet, Take 1 tablet (10 mg total) by mouth 3 (three) times daily as  needed for muscle spasms., Disp: 30 tablet, Rfl: 0 .  esomeprazole (NEXIUM) 20 MG capsule, Take 20 mg by mouth., Disp: , Rfl:  .  predniSONE (STERAPRED UNI-PAK 21 TAB) 10 MG (21) TBPK tablet, 6 tablets on day 1, then decrease by 1 tablet daily until gone., Disp: 21 tablet, Rfl: 0   Allergies:  Patient has no known allergies.  Review of Systems: Gen:  Denies  fever, sweats, chills HEENT: Denies blurred vision, double vision. bleeds, sore throat Cvc:  No dizziness, chest pain. Resp:   Denies cough or sputum production, shortness of breath Gi: Denies swallowing difficulty, stomach pain. Gu:  Denies bladder incontinence, burning urine Ext:   No Joint pain, stiffness. Skin: No skin rash,  hives  Endoc:  No polyuria, polydipsia. Psych: No depression, insomnia. Other:  All other systems were reviewed with the patient and were negative other that what is mentioned in the HPI.   Physical Examination:   VS: BP 110/68 (BP Location: Left Arm, Cuff Size: Normal)   Pulse 78   Ht 6' (1.829 m)   Wt 73.5 kg (162 lb)   SpO2 97%   BMI 21.97 kg/m   General Appearance: No distress  Neuro:without focal findings,  speech normal,  HEENT: PERRLA, EOM intact.   Pulmonary: normal breath sounds, No wheezing.  CardiovascularNormal S1,S2.  No m/r/g.   Abdomen: Benign, Soft, non-tender. Renal:  No costovertebral tenderness  GU:  No performed at this time. Endoc: No evident thyromegaly, no signs of acromegaly. Skin:   warm, no rashes, no ecchymosis  Extremities: normal, no cyanosis, clubbing.  Other findings:    LABORATORY PANEL:   CBC No results for input(s): WBC, HGB, HCT, PLT in the last 168 hours. ------------------------------------------------------------------------------------------------------------------  Chemistries  No results for input(s): NA, K, CL, CO2, GLUCOSE, BUN, CREATININE, CALCIUM, MG, AST, ALT, ALKPHOS, BILITOT in the last 168 hours.  Invalid input(s):  GFRCGP ------------------------------------------------------------------------------------------------------------------  Cardiac Enzymes No results for input(s): TROPONINI in the last 168 hours. ------------------------------------------------------------  RADIOLOGY:  No results found.     Thank  you for the consultation and for allowing [facility] to assist in the care of your patient. Our recommendations are noted above.  Please contact us if we can be of further service.   Wells Guileseep Talitha Dicarlo, MD.  Board Certified in Internal Medicine, Pulmonary Medicine, Critical Care Medicine, and Sleep Medicine.  [place] [facility] Office Number: [phone]  Santiago Gladavid Kasa, M.D.  Billy Fischeravid Simonds, M.D  12/04/2016

## 2016-12-05 ENCOUNTER — Ambulatory Visit: Payer: Self-pay | Admitting: Family Medicine

## 2016-12-07 ENCOUNTER — Ambulatory Visit
Admission: RE | Admit: 2016-12-07 | Discharge: 2016-12-07 | Disposition: A | Discharge status: Home / Self Care | Payer: BLUE CROSS/BLUE SHIELD | Source: Ambulatory Visit | Attending: Internal Medicine | Admitting: Internal Medicine

## 2016-12-07 ENCOUNTER — Ambulatory Visit (INDEPENDENT_AMBULATORY_CARE_PROVIDER_SITE_OTHER): Payer: BLUE CROSS/BLUE SHIELD | Admitting: Internal Medicine

## 2016-12-07 ENCOUNTER — Encounter: Payer: Self-pay | Admitting: Internal Medicine

## 2016-12-07 VITALS — BP 110/68 | HR 78 | Ht 72.0 in | Wt 162.0 lb

## 2016-12-07 DIAGNOSIS — R0789 Other chest pain: Secondary | ICD-10-CM

## 2016-12-07 DIAGNOSIS — R0609 Other forms of dyspnea: Secondary | ICD-10-CM

## 2016-12-07 DIAGNOSIS — R079 Chest pain, unspecified: Secondary | ICD-10-CM | POA: Diagnosis not present

## 2016-12-07 DIAGNOSIS — K449 Diaphragmatic hernia without obstruction or gangrene: Secondary | ICD-10-CM | POA: Diagnosis not present

## 2016-12-07 MED ORDER — IOPAMIDOL (ISOVUE-370) INJECTION 76%
75.0000 mL | Freq: Once | INTRAVENOUS | Status: AC | PRN
  Administered 2016-12-07: 75 mL via INTRAVENOUS

## 2016-12-07 MED ORDER — FLUTICASONE FUROATE 200 MCG/ACT IN AEPB: 1.0000 | INHALATION_SPRAY | Freq: Every day | RESPIRATORY_TRACT | 0 refills | Status: DC

## 2016-12-07 NOTE — Addendum Note (Signed)
Addended by: Alease FrameARTER, Majorie Santee S on: 12/07/2016 02:49 PM   Modules accepted: Orders

## 2016-12-07 NOTE — Patient Instructions (Addendum)
--  Refer to gastroenterology.   --Will send for Ct chest today.   --Use Arnuity inhaler once daily, rinse mouth after each use. If you feel better with it, call and we will prescribe it for you.

## 2016-12-07 NOTE — Addendum Note (Signed)
Addended by: Shane CrutchAMACHANDRAN, Kyasia Steuck on: 12/07/2016 05:11 PM   Modules accepted: Orders

## 2016-12-12 ENCOUNTER — Telehealth: Payer: Self-pay | Admitting: Internal Medicine

## 2016-12-12 NOTE — Telephone Encounter (Signed)
Pt made aware there are still test pending. He was told that per Dr. Ardyth Manam Ct scan is ok.

## 2016-12-12 NOTE — Telephone Encounter (Signed)
He is a very nervous guy, this is why I called him immediately after the initial CT scan to explain the results. Please let him know that there are no acute changes seen on this test. After all tests have been performed, (PFT, apha-1 testing), they will be discussed at his next clinic visit, unless there is something urgent found.

## 2016-12-12 NOTE — Telephone Encounter (Signed)
Pt calling asking if we can call him on the CT scan  He is reading them and doesn't understand them He is concerned   Please call back

## 2016-12-13 ENCOUNTER — Telehealth: Payer: Self-pay | Admitting: Internal Medicine

## 2016-12-13 NOTE — Telephone Encounter (Signed)
-----   Message from [facility] Hospital[facility]isty R Ahmad, [place]N sent at 1/61/09606/26/2018  9:13 AM EDT ----- Please call pt to come in within the next month for an alpha 1 test. Try to place on DR's schedule if possible. Please put in comments "alpha 1 testing only"  Thanks, Misty ----- Message ----- From: Shane Crutchamachandran, Pradeep, MD Sent: 12/12/2016   4:36 PM To: Renea EeMisty R Ahmad, LPN  Does not need to be on my schedule can be any day that I or any doctor are in the office that can sign the form.    ----- Message ----- From: Renea EeAhmad, Misty R, LPN Sent: 4/54/09816/21/2018   9:03 AM To: Shane CrutchPradeep Ramachandran, MD  Would like for us to schedule him on your office schedule to get alpha 1 done due to we don't have a nurse schedule?  Misty ----- Message ----- From: Shane Crutchamachandran, Pradeep, MD Sent: 12/07/2016   5:08 PM To: [facility]  Ct chest results phoned to patient, some suggestion of early emphysema seen.   Placed order for full PFT before next visit.  --Needs alpha-1 screen to be performed in office in next month (so results will be available for 3 mo followup).

## 2016-12-13 NOTE — Telephone Encounter (Signed)
Lmov for patient to call back and schedule appointment with Dr Ardyth Manam  Will try again at a later time

## 2016-12-14 ENCOUNTER — Encounter: Payer: Self-pay | Admitting: Internal Medicine

## 2016-12-15 ENCOUNTER — Encounter: Payer: Self-pay | Admitting: Internal Medicine

## 2016-12-30 DIAGNOSIS — H9201 Otalgia, right ear: Secondary | ICD-10-CM | POA: Diagnosis not present

## 2016-12-30 DIAGNOSIS — H6121 Impacted cerumen, right ear: Secondary | ICD-10-CM | POA: Diagnosis not present

## 2017-01-05 ENCOUNTER — Ambulatory Visit: Payer: Self-pay | Admitting: Family Medicine

## 2017-01-17 ENCOUNTER — Other Ambulatory Visit: Payer: Self-pay | Admitting: Internal Medicine

## 2017-01-17 ENCOUNTER — Encounter: Payer: Self-pay | Admitting: Internal Medicine

## 2017-01-17 MED ORDER — UMECLIDINIUM-VILANTEROL 62.5-25 MCG/INH IN AEPB: 1.0000 | INHALATION_SPRAY | Freq: Every day | RESPIRATORY_TRACT | 1 refills | Status: DC

## 2017-01-20 ENCOUNTER — Encounter: Payer: Self-pay | Admitting: Family Medicine

## 2017-01-23 ENCOUNTER — Ambulatory Visit: Payer: BLUE CROSS/BLUE SHIELD | Admitting: Gastroenterology

## 2017-02-17 ENCOUNTER — Ambulatory Visit: Payer: Self-pay | Admitting: Family Medicine

## 2017-03-02 ENCOUNTER — Ambulatory Visit: Payer: BLUE CROSS/BLUE SHIELD | Attending: Internal Medicine

## 2017-03-02 DIAGNOSIS — R0609 Other forms of dyspnea: Secondary | ICD-10-CM | POA: Diagnosis not present

## 2017-03-02 DIAGNOSIS — Z87891 Personal history of nicotine dependence: Secondary | ICD-10-CM | POA: Insufficient documentation

## 2017-03-07 ENCOUNTER — Encounter: Payer: Self-pay | Admitting: Internal Medicine

## 2017-03-07 NOTE — Progress Notes (Signed)
[facility] [place] Pulmonary Medicine Consultation      Assessment and Plan:  The patient is a 41 year old male with symptoms of cough and chest wall pain.  Chest pain -Recent pneumonia, with chest pain since that time. CT chest negative. Likely chest wall pain.    Chronic bronchitis. -Chronic cough, likely secondary to excess sputum production, asked to use Delsym as needed. -Patient had no improvement with steroid or Anoro inhalers. -The patient has a long thin torso, with large lung volumes, this gives the impression of COPD on his pulmonary function ratio, however, his lung volumes are normal with normal FEV1, therefore, I do not think that this patient has COPD.  Reflux/Gerd.  -He has a history of severe reflux, controlled on Nexium twice a day. -He underwent esophageal stretching approximately 5 years ago, he does not feel that this is worsened since that time. -.However, given his pulmonary symptoms,  he was referred back to gastroenterology.   Date: 03/07/2017  MRN# [id number] FREDY GLADU 1975/12/14   Loretta Plume is a 41 y.o. old male seen in consultation for chief complaint of:    Chief Complaint  Patient presents with  . Follow-up    PFT results:  dry cough    HPI:   The patient recently underwent an evaluation for pleuritic chest pain, which included a chest radiograph. Apparently this was read as hyperinflation consistent with emphysema. He was sent from here for a CT chest r/o PE due to pleuritic chest pain which showed some mild areas of emphysematous change, therefore sent for a full PFT and alpha-1 screening.  His main complaint at last visit was a burning sensation in his right lung posteriorly which occurs every few days. This has been present since a bout of pneumonia which occurred in December of 2017. He takes 2 nexium daily for several years.  He has had his esophagus stretched about 5 years ago. Previously has symptomatic esophagitis, but has no  further symptoms as long as he takes nexium.  At last visit he was sent for a PFT and alpha-1 screening. He was asked to continue nexium, follow up with gastroenterology and started on a trial of arnuity.   Since his last visit he was put on arnuity which did not make any difference. He was tried on anoro for some time but noticed no difference.   I personally reviewed tracings; PFT 03/02/17;  Spirometry is normal, FEV1=118%, ratio is 70% Volumes shows hyperinflation with TLC=147% DLCO is normal at 99%; Flow volume loop suggests borderline obstruction.  Overall this test is normal, but there is some evidence of borderline obstruction.   Alpha-1 test, 12/14/16; normal   He last smoked about 5 years ago. No one at home smokes, he has a inside cat, occasionally in bedroom.   Chest x-ray images, there is some degree of hyperinflation, though this appears to be normal for age, and does not necessarily indicate emphysema.  Medication:    Current Outpatient Prescriptions:  .  cyclobenzaprine (FLEXERIL) 10 MG tablet, Take 1 tablet (10 mg total) by mouth 3 (three) times daily as needed for muscle spasms., Disp: 30 tablet, Rfl: 0 .  esomeprazole (NEXIUM) 20 MG capsule, Take 20 mg by mouth., Disp: , Rfl:  .  Fluticasone Furoate (ARNUITY ELLIPTA) 200 MCG/ACT AEPB, Inhale 1 puff into the lungs daily., Disp: 30 each, Rfl: 0 .  predniSONE (STERAPRED UNI-PAK 21 TAB) 10 MG (21) TBPK tablet, 6 tablets on day 1, then decrease by  1 tablet daily until gone., Disp: 21 tablet, Rfl: 0 .  umeclidinium-vilanterol (ANORO ELLIPTA) 62.5-25 MCG/INH AEPB, Inhale 1 puff into the lungs daily., Disp: 60 each, Rfl: 1   Allergies:  Patient has no known allergies.  Review of Systems: Gen:  Denies  fever, sweats, chills HEENT: Denies blurred vision, double vision. bleeds, sore throat Cvc:  No dizziness, chest pain. Resp:   Denies cough or sputum production, shortness of breath Gi: Denies swallowing difficulty, stomach  pain. Gu:  Denies bladder incontinence, burning urine Ext:   No Joint pain, stiffness. Skin: No skin rash,  hives  Endoc:  No polyuria, polydipsia. Psych: No depression, insomnia. Other:  All other systems were reviewed with the patient and were negative other that what is mentioned in the HPI.   Physical Examination:   VS: BP 124/76 (BP Location: Left Arm, Cuff Size: Normal)   Pulse (!) 107   Ht 6' (1.829 m)   Wt 163 lb (73.9 kg)   SpO2 100%   BMI 22.11 kg/m   General Appearance: No distress  Neuro:without focal findings,  speech normal,  HEENT: PERRLA, EOM intact.   Pulmonary: normal breath sounds, No wheezing.  CardiovascularNormal S1,S2.  No m/r/g.   Abdomen: Benign, Soft, non-tender. Renal:  No costovertebral tenderness  GU:  No performed at this time. Endoc: No evident thyromegaly, no signs of acromegaly. Skin:   warm, no rashes, no ecchymosis  Extremities: normal, no cyanosis, clubbing.  Other findings:    LABORATORY PANEL:   CBC No results for input(s): WBC, HGB, HCT, PLT in the last 168 hours. ------------------------------------------------------------------------------------------------------------------  Chemistries  No results for input(s): NA, K, CL, CO2, GLUCOSE, BUN, CREATININE, CALCIUM, MG, AST, ALT, ALKPHOS, BILITOT in the last 168 hours.  Invalid input(s): GFRCGP ------------------------------------------------------------------------------------------------------------------  Cardiac Enzymes No results for input(s): TROPONINI in the last 168 hours. ------------------------------------------------------------  RADIOLOGY:  No results found.     Thank  you for the consultation and for allowing [facility] [place] [facility], [facility] Care to assist in the care of your patient. Our recommendations are noted above.  Please contact us if we can be of further service.   Wells Guiles, MD.  Board Certified in Internal Medicine, Pulmonary Medicine,  Critical Care Medicine, and Sleep Medicine.  [place] [facility] Office Number: [phone]  Santiago Glad, M.D.  Billy Fischer, M.D  03/07/2017

## 2017-03-09 ENCOUNTER — Ambulatory Visit (INDEPENDENT_AMBULATORY_CARE_PROVIDER_SITE_OTHER): Payer: BLUE CROSS/BLUE SHIELD | Admitting: Internal Medicine

## 2017-03-09 ENCOUNTER — Encounter: Payer: Self-pay | Admitting: Internal Medicine

## 2017-03-09 VITALS — BP 124/76 | HR 107 | Ht 72.0 in | Wt 163.0 lb

## 2017-03-09 DIAGNOSIS — R0609 Other forms of dyspnea: Secondary | ICD-10-CM

## 2017-03-09 NOTE — Patient Instructions (Signed)
--  Try to pick up your activity level and start a regular exercise regimen.

## 2017-03-10 ENCOUNTER — Encounter: Payer: Self-pay | Admitting: Internal Medicine

## 2017-06-02 DIAGNOSIS — J020 Streptococcal pharyngitis: Secondary | ICD-10-CM | POA: Diagnosis not present

## 2017-06-30 DIAGNOSIS — K219 Gastro-esophageal reflux disease without esophagitis: Secondary | ICD-10-CM | POA: Diagnosis not present

## 2017-06-30 DIAGNOSIS — J0100 Acute maxillary sinusitis, unspecified: Secondary | ICD-10-CM | POA: Diagnosis not present

## 2017-06-30 DIAGNOSIS — J329 Chronic sinusitis, unspecified: Secondary | ICD-10-CM | POA: Diagnosis not present

## 2017-06-30 DIAGNOSIS — R05 Cough: Secondary | ICD-10-CM | POA: Diagnosis not present

## 2017-12-14 ENCOUNTER — Encounter: Payer: Self-pay | Admitting: Gastroenterology

## 2017-12-14 DIAGNOSIS — K219 Gastro-esophageal reflux disease without esophagitis: Secondary | ICD-10-CM | POA: Diagnosis not present

## 2018-01-01 DIAGNOSIS — R1314 Dysphagia, pharyngoesophageal phase: Secondary | ICD-10-CM | POA: Diagnosis not present

## 2018-01-01 DIAGNOSIS — K219 Gastro-esophageal reflux disease without esophagitis: Secondary | ICD-10-CM | POA: Diagnosis not present

## 2018-02-15 ENCOUNTER — Encounter: Payer: Self-pay | Admitting: Gastroenterology

## 2018-02-15 ENCOUNTER — Encounter

## 2018-02-15 ENCOUNTER — Ambulatory Visit (INDEPENDENT_AMBULATORY_CARE_PROVIDER_SITE_OTHER): Payer: BLUE CROSS/BLUE SHIELD | Admitting: Gastroenterology

## 2018-02-15 VITALS — BP 110/72 | HR 76 | Ht 72.0 in | Wt 172.5 lb

## 2018-02-15 DIAGNOSIS — R140 Abdominal distension (gaseous): Secondary | ICD-10-CM

## 2018-02-15 DIAGNOSIS — K219 Gastro-esophageal reflux disease without esophagitis: Secondary | ICD-10-CM

## 2018-02-15 DIAGNOSIS — N200 Calculus of kidney: Secondary | ICD-10-CM | POA: Insufficient documentation

## 2018-02-15 MED ORDER — SUCRALFATE 1 GM/10ML PO SUSP: 1.0000 g | Freq: Two times a day (BID) | ORAL | 1 refills | Status: AC

## 2018-02-15 NOTE — Patient Instructions (Signed)
If you are age 42 or older, your body mass index should be between 23-30. Your Body mass index is 23.4 kg/m. If this is out of the aforementioned range listed, please consider follow up with your Primary Care Provider.  If you are age 42 or younger, your body mass index should be between 19-25. Your Body mass index is 23.4 kg/m. If this is out of the aformentioned range listed, please consider follow up with your Primary Care Provider.   We have sent the following medications to your pharmacy for you to pick up at your convenience: Carafate  It was a pleasure to see you today!  Dr. Myrtie Neitheranis

## 2018-02-15 NOTE — Progress Notes (Addendum)
[facility] Consult Note:  History: Jose Schmidt 02/15/2018  Referring physician: Self-referred   Reason for consult/chief complaint: Gastroesophageal Reflux (Nexium has not been helpful in the past. Now taking Ranitidine 150 qd and Pantoprazole 40 BID with relief); Chest Pain (radiates to back); and Dark stools (Seen two days ago. Admits to taking Pepto qod.)   Subjective  HPI:  This is a pleasant 42 year old man self-referred for heartburn. He requested another opinion for many years of heartburn and suspected GERD.  He is previously been seen by practices in [place] and at [facility], no records currently available.  Request for records from [place] prior to this visit were returned since the requested not been signed by patient.  He is unclear about the timing or findings of tests.  It sounds like he has had an upper endoscopy at least once and may have had a dilation performed.  That was at least several years ago and he has had no recurrent dysphagia since then.  He was having frequent symptoms of regurgitation and pyrosis, that seemed to improve after he quit smoking several years ago.  He had recurrence of a chest discomfort described as sometimes burning but also just a pressure and bloating in the upper abdomen/lower chest and in the back.  It may feel like it is pulsating.  This is nonexertional.  He denies nausea or vomiting.  I am able to read a note from Dr. Suzanna Obey of the local ENT practice from 01/01/2018. This an excerpt from his note:" Jose Schmidt is a 42 y.o. male who presents as a new patient with a chief complaint of long history of reflux issues which is fairly profound. He has been on Nexium for a long time. Here in the last 1 to 2 weeks he has had burning feeling in his throat. He has a gurgling sensation up to the top part of his chest and throat. He has some discomfort. It feels like it slightly difficult to swallow. He has had some of  the symptoms before but not quite as pronounced. Seen at urgent care and placed on Zantac which has improved the symptoms. He takes Nexium once a day in the morning. He has had esophageal dilation before. Due to see a GI doctor the end of next month. He has not had any upper respiratory symptoms. He has not had a fever."  Since that ENT evaluation, he has been on pantoprazole 40 milligrams twice daily and ranitidine 150 mg midday.  The symptoms have significantly decreased.  He reports chewing peppermint gum several times a day, and even during the visit he felt that that was giving him some chest discomfort.  He also sucks on 30-40 cough drops per day for at least the last year, and feels like it is just become habit.  Has had hard candies and chewing gum ever since he quit smoking.  He also thinks perhaps anxiety exacerbate some symptoms.  The seem to get worse last year after the death of his father.  Tag feels as if he worries a bit too much about things.  At times he also eats late in the evening.  His bowel habits are regular, and he has had a couple occasions of "dark stools".  No frank rectal bleeding.  ROS:  Review of Systems  Constitutional: Negative for appetite change and unexpected weight change.  HENT: Negative for mouth sores and voice change.   Eyes: Negative for pain and redness.  Respiratory: Negative for cough and shortness of breath.   Cardiovascular: Negative for chest pain and palpitations.  Genitourinary: Negative for dysuria and hematuria.  Musculoskeletal: Negative for arthralgias and myalgias.  Skin: Negative for pallor and rash.  Neurological: Negative for weakness and headaches.  Hematological: Negative for adenopathy.     Past Medical History: Past Medical History:  Diagnosis Date  . GERD (gastroesophageal reflux disease)   . Kidney stones   . Pneumonia   . Sleep apnea      Past Surgical History: Past Surgical History:  Procedure Laterality Date  .  INGUINAL HERNIA REPAIR     Bilateral     Family History: Family History  Problem Relation Age of Onset  . Heart attack Father   . Deep vein thrombosis Father   . Hyperlipidemia Father   . Hypertension Father   . Heart attack Maternal Grandfather   . Lung cancer Paternal Grandfather        ? if he smoked or not  . Hyperlipidemia Mother   . Hypertension Mother   . Hypertension Maternal Grandmother     Social History: Social History   Socioeconomic History  . Marital status: Married    Spouse name: Not on file  . Number of children: 2  . Years of education: Not on file  . Highest education level: Not on file  Occupational History  . 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 Financial resource strain: Not on file  . Food insecurity:    Worry: Not on file    Inability: Not on file  . Transportation needs:    Medical: Not on file    Non-medical: Not on file  Tobacco Use  . Smoking status: Former Smoker    Packs/day: 0.50    Years: 20.00    Pack years: 10.00    Types: Cigarettes    Last attempt to quit: 03/20/2012    Years since quitting: 5.9  . Smokeless tobacco: Former Engineer, water and Sexual Activity  . Alcohol use: Yes    Alcohol/week: 6.0 standard drinks    Types: 6 Standard drinks or equivalent per week    Comment: occasional   . Drug use: No  . Sexual activity: Not on file  Lifestyle  . Physical activity:    Days per week: Not on file    Minutes per session: Not on file  . Stress: Not on file  Relationships  . Social connections:    Talks on phone: Not on file    Gets together: Not on file    Attends religious service: Not on file    Active member of club or organization: Not on file    Attends meetings of clubs or organizations: Not on file    Relationship status: Not on file  Other Topics Concern  . Not on file  Social History Narrative  . 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 pantoprazole (PROTONIX) 40 MG tablet Take 40 mg by mouth 2 (two) times daily.    . ranitidine (ZANTAC) 150 MG tablet Take 150 mg by mouth daily.    . sucralfate (CARAFATE) 1 GM/10ML suspension Take 10 mLs (1 g total) by mouth 2 (two) times daily. As needed for breakthrough heartburn 420 mL 1   No current facility-administered medications for this visit.  ___________________________________________________________________ Objective   Exam:  BP 110/72   Pulse 76   Ht 6' (1.829 m)   Wt 172 lb 8 oz (78.2 kg)   BMI 23.40 kg/m    General: this is a(n) well-appearing man, pleasant and conversational, normal vocal quality.  Chewing gum  Eyes: sclera anicteric, no redness  ENT: oral mucosa moist without lesions, no cervical or supraclavicular lymphadenopathy, there dentition  CV: RRR without murmur, S1/S2, no JVD, no peripheral edema  Resp: clear to auscultation bilaterally, normal RR and effort noted  GI: soft, no tenderness, with active bowel sounds. No guarding or palpable organomegaly noted.  Skin; warm and dry, no rash or jaundice noted  Neuro: awake, alert and oriented x 3. Normal gross motor function and fluent speech  Labs:  No data for review  Assessment: Encounter Diagnoses  Name Primary?  . Gastroesophageal reflux disease, esophagitis presence not specified Yes  . Abdominal bloating     While it sounds like he has had reflux symptoms of pyrosis and regurgitation, those more classic symptoms were bothering him in the past, perhaps when he was still smoking.  He still gets a burning chest discomfort but as he describes it, there is also a pressure and bloating that is not clearly reflux.  He does not have red flag symptoms such as recurrent dysphagia, nausea, vomiting, anorexia or weight loss.  There may be a functional component to the  symptoms.  Plan:  Discontinue ranitidine Continue pantoprazole 40 mg twice daily for now, reducing the second dose to suppertime rather than late evening. He signed a release request to hopefully get records from [facility]. I have not presently scheduled further testing such as upper endoscopy until I can hopefully review records. Make efforts to stop chewing gum and hard candies. Trial of Carafate liquid, 10 cc 2 or 3 times daily as needed when he has the episodes as described above.  Again, all symptoms seem to have decreased a lot since he saw ENT and had a medicine change about 6 weeks ago.  Thank you for the courtesy of this consult.  Please call me with any questions or concerns.  Charlie PitterHenry L Danis III  CC: Tommie SamsCook, Jayce G, DO   Addendum 03/06/18 after records rec'd:  Saw Dr. Dulce Sellarutlaw at [facility] 08/01/13 for follow up of GERD on omeprazole and ranitidine.  Symptoms improved from hwat first described in 04/2013 office consult (when symptoms very much like what was described with me in note above).  Had EGD dilation of Schatzki ring 05/21/13 found ring.  Duodenal bx normal.  Gastric Bx neg for HP or IM. Ellwood DenseH. Danis, MD

## 2018-03-05 ENCOUNTER — Other Ambulatory Visit: Payer: Self-pay

## 2018-03-05 ENCOUNTER — Telehealth: Payer: Self-pay | Admitting: Gastroenterology

## 2018-03-05 MED ORDER — PANTOPRAZOLE SODIUM 40 MG PO TBEC: 40.0000 mg | DELAYED_RELEASE_TABLET | Freq: Two times a day (BID) | ORAL | 3 refills | Status: AC

## 2018-03-05 NOTE — Telephone Encounter (Signed)
Refilled a month supply. Last seen 02-15-2018.

## 2018-03-16 DIAGNOSIS — R05 Cough: Secondary | ICD-10-CM | POA: Diagnosis not present

## 2018-03-16 DIAGNOSIS — J209 Acute bronchitis, unspecified: Secondary | ICD-10-CM | POA: Diagnosis not present

## 2018-05-29 DIAGNOSIS — Z7689 Persons encountering health services in other specified circumstances: Secondary | ICD-10-CM | POA: Diagnosis not present

## 2018-05-29 DIAGNOSIS — F331 Major depressive disorder, recurrent, moderate: Secondary | ICD-10-CM | POA: Diagnosis not present

## 2018-05-29 DIAGNOSIS — Z125 Encounter for screening for malignant neoplasm of prostate: Secondary | ICD-10-CM | POA: Diagnosis not present

## 2018-05-29 DIAGNOSIS — K219 Gastro-esophageal reflux disease without esophagitis: Secondary | ICD-10-CM | POA: Diagnosis not present

## 2018-06-01 ENCOUNTER — Telehealth: Payer: Self-pay | Admitting: Gastroenterology

## 2018-06-01 NOTE — Telephone Encounter (Signed)
Pt has been told by his PCP that his b12 level is low. States he did some research and thinks it may be due to the PPI he is taking, protonix. Pt wants to know if he should switch to a different medication. Please advise.

## 2018-06-01 NOTE — Telephone Encounter (Signed)
PPI medicines rarely cause B12 deficiency.  Recommend B12 replacement by primary care.

## 2018-06-04 NOTE — Telephone Encounter (Signed)
Left message for pt regarding Dr. Myrtie Neitheranis' recommendations.

## 2018-07-05 DIAGNOSIS — F331 Major depressive disorder, recurrent, moderate: Secondary | ICD-10-CM | POA: Diagnosis not present

## 2018-07-05 DIAGNOSIS — M533 Sacrococcygeal disorders, not elsewhere classified: Secondary | ICD-10-CM | POA: Diagnosis not present

## 2018-07-05 DIAGNOSIS — K219 Gastro-esophageal reflux disease without esophagitis: Secondary | ICD-10-CM | POA: Diagnosis not present

## 2018-07-05 DIAGNOSIS — K625 Hemorrhage of anus and rectum: Secondary | ICD-10-CM | POA: Diagnosis not present

## 2019-02-27 ENCOUNTER — Other Ambulatory Visit: Payer: Self-pay | Admitting: Internal Medicine

## 2019-02-27 DIAGNOSIS — R1031 Right lower quadrant pain: Secondary | ICD-10-CM

## 2019-02-28 ENCOUNTER — Other Ambulatory Visit: Payer: Self-pay

## 2019-02-28 ENCOUNTER — Ambulatory Visit
Admission: RE | Admit: 2019-02-28 | Discharge: 2019-02-28 | Disposition: A | Discharge status: Home / Self Care | Payer: BC Managed Care – PPO | Source: Ambulatory Visit | Attending: Internal Medicine | Admitting: Internal Medicine

## 2019-02-28 DIAGNOSIS — R1031 Right lower quadrant pain: Secondary | ICD-10-CM | POA: Insufficient documentation

## 2019-02-28 MED ORDER — IOHEXOL 300 MG/ML  SOLN
100.0000 mL | Freq: Once | INTRAMUSCULAR | Status: AC | PRN
  Administered 2019-02-28: 10:00:00 100 mL via INTRAVENOUS

## 2023-02-02 NOTE — Progress Notes (Signed)
 Goals     . Follow my doctor's care plan     Follow my doctors treatment plan and follow-up as scheduled. Take all medications as prescribed and report any changes as necessary.     . Maintain health/healthy lifestyle     Patient states they would like to maintain a healthy lifestyle by maintaining a healthy diet and exercise routine       \

## 2023-02-02 NOTE — Progress Notes (Signed)
 No chief complaint on file.   HPI  Draydon Clairmont is a 47 y.o. here for a Follow up Has been doing well from a mood stand point- On Citalopram (Did not tolerate Zoloft and is back on Citalopram)  Headaches have improved - Occasionally takes Goodies powder Denies chest pains or shortness of breath Ex smoker- Quit 14 yrs back (< than 1 ppd x 15 yrs)  Alcohol 2-3 drinks evetry other day  Sleeps ok  Married- 2  children  Appetite is normal. Works as a Administrator, arts in [employer]; Hgb; 12.8 Sugar: 109 Se Creat;1.1 A1c; 5.7,  Total Cholesterol; 217 Triglycerides;53 TSH; 1.259 Vit D ; 48.8 ;  and PSA: 0.4.   ROS Rest of 10 point review of systems is normal.  Outpatient Encounter Medications as of 02/02/2023  Medication Sig Dispense Refill  . iron polysaccharides (FERREX) 150 mg iron capsule Take 1 capsule (150 mg total) by mouth once daily 30 capsule 5  . topiramate (TOPAMAX) 25 MG tablet Take 1 tablet (25 mg total) by mouth once daily for 180 days 90 tablet 1  . citalopram (CELEXA) 40 MG tablet Take 40 mg by mouth once daily    . sertraline (ZOLOFT) 50 MG tablet Take 1 tablet (50 mg total) by mouth once daily for 180 days (Patient not taking: Reported on 02/02/2023) 90 tablet 1   No facility-administered encounter medications on file as of 02/02/2023.    Allergies as of 02/02/2023  . (No Known Allergies)    Past Medical History:  Diagnosis Date  . Anxiety   . GERD (gastroesophageal reflux disease)     Past Surgical History:  Procedure Laterality Date  . REPAIR INCISIONAL/VENTRAL HERNIA  2016   x2 same time - low abdomen  . DILATION ESOPHAGUS     around age 54    Vitals:   02/02/23 1155  BP: 122/80  Pulse: 74   Body mass index is 24.07 kg/m.   Appointment on 01/30/2023  Component Date Value Ref Range Status  . WBC (White Blood Cell Count) 01/30/2023 5.1  4.1 - 10.2 10^3/uL Final  . RBC (Red Blood Cell Count) 01/30/2023 4.34 (L)  4.69 - 6.13  10^6/uL Final  . Hemoglobin 01/30/2023 12.8 (L)  14.1 - 18.1 gm/dL Final  . Hematocrit 91/87/7975 39.5 (L)  40.0 - 52.0 % Final  . MCV (Mean Corpuscular Volume) 01/30/2023 91.0  80.0 - 100.0 fl Final  . MCH (Mean Corpuscular Hemoglobin) 01/30/2023 29.5  27.0 - 31.2 pg Final  . MCHC (Mean Corpuscular Hemoglobin * 01/30/2023 32.4  32.0 - 36.0 gm/dL Final  . Platelet Count 01/30/2023 226  150 - 450 10^3/uL Final  . RDW-CV (Red Cell Distribution Widt* 01/30/2023 13.7  11.6 - 14.8 % Final  . MPV (Mean Platelet Volume) 01/30/2023 9.5  9.4 - 12.4 fl Final  . Neutrophils 01/30/2023 3.21  1.50 - 7.80 10^3/uL Final  . Lymphocytes 01/30/2023 1.17  1.00 - 3.60 10^3/uL Final  . Monocytes 01/30/2023 0.40  0.00 - 1.50 10^3/uL Final  . Eosinophils 01/30/2023 0.26  0.00 - 0.55 10^3/uL Final  . Basophils 01/30/2023 0.04  0.00 - 0.09 10^3/uL Final  . Neutrophil % 01/30/2023 63.0  32.0 - 70.0 % Final  . Lymphocyte % 01/30/2023 23.0  10.0 - 50.0 % Final  . Monocyte % 01/30/2023 7.9  4.0 - 13.0 % Final  . Eosinophil % 01/30/2023 5.1 (H)  1.0 - 5.0 % Final  . Basophil% 01/30/2023 0.8  0.0 -  2.0 % Final  . Immature Granulocyte % 01/30/2023 0.2  <=0.7 % Final  . Immature Granulocyte Count 01/30/2023 0.01  <=0.06 10^3/L Final  . Glucose 01/30/2023 109  70 - 110 mg/dL Final  . Sodium 91/87/7975 139  136 - 145 mmol/L Final  . Potassium 01/30/2023 4.6  3.6 - 5.1 mmol/L Final  . Chloride 01/30/2023 103  97 - 109 mmol/L Final  . Carbon Dioxide (CO2) 01/30/2023 27.6  22.0 - 32.0 mmol/L Final  . Urea Nitrogen (BUN) 01/30/2023 13  7 - 25 mg/dL Final  . Creatinine 91/87/7975 1.1  0.7 - 1.3 mg/dL Final  . Glomerular Filtration Rate (eGFR) 01/30/2023 83  >60 mL/min/1.73sq m Final   CKD-EPI (2021) does not include patient's race in the calculation of eGFR.  Monitoring changes of plasma creatinine and eGFR over time is useful for monitoring kidney function.   Interpretive Ranges for eGFR (CKD-EPI 2021):  eGFR:       >60  mL/min/1.73 sq. m - Normal eGFR:       30-59 mL/min/1.73 sq. m - Moderately Decreased eGFR:       15-29 mL/min/1.73 sq. m  - Severely Decreased eGFR:       < 15 mL/min/1.73 sq. m  - Kidney Failure    Note: These eGFR calculations do not apply in acute situations when eGFR is changing rapidly or patients on dialysis.  . Calcium 01/30/2023 9.3  8.7 - 10.3 mg/dL Final  . AST  91/87/7975 18  8 - 39 U/L Final  . ALT  01/30/2023 16  6 - 57 U/L Final  . Alk Phos (alkaline Phosphatase) 01/30/2023 60  34 - 104 U/L Final  . Albumin 01/30/2023 4.4  3.5 - 4.8 g/dL Final  . Bilirubin, Total 01/30/2023 0.4  0.3 - 1.2 mg/dL Final  . Protein, Total 01/30/2023 7.0  6.1 - 7.9 g/dL Final  . A/G Ratio 91/87/7975 1.7  1.0 - 5.0 gm/dL Final  . Hemoglobin J8R 01/30/2023 5.7 (H)  4.2 - 5.6 % Final  . Average Blood Glucose (Calc) 01/30/2023 117  mg/dL Final  . Cholesterol, Total 01/30/2023 217 (H)  100 - 200 mg/dL Final  . Triglyceride 91/87/7975 53  35 - 199 mg/dL Final  . HDL (High Density Lipoprotein) Cho* 01/30/2023 78.5 (H)  29.0 - 71.0 mg/dL Final  . LDL Calculated 01/30/2023 871  0 - 130 mg/dL Final  . VLDL Cholesterol 01/30/2023 11  mg/dL Final  . Cholesterol/HDL Ratio 01/30/2023 2.8   Final  . Creatinine, Random Urine 01/30/2023 148.6  40.0 - 300.0 mg/dL Final  . Urine Albumin, Random 01/30/2023 <7    mg/L Final  . Urine Albumin/Creatinine Ratio 01/30/2023 <4.7  <30.0 ug/mg Final   Urine:         Spot collection              (g/mg creatinine)     Normal               < 30   Moderately          30-299         increased   Clinical             >=300 albuminuria  . Thyroid Stimulating Hormone (TSH) 01/30/2023 1.259  0.450-5.330 uIU/ml uIU/mL Final  . Color 01/30/2023 Light Yellow  Colorless, Straw, Light Yellow, Yellow, Dark Yellow Final  . Clarity 01/30/2023 Clear  Clear Final  . Specific Gravity 01/30/2023 1.020  1.005 - 1.030 Final  .  pH, Urine 01/30/2023 5.5  5.0 - 8.0 Final  . Protein,  Urinalysis 01/30/2023 Negative  Negative mg/dL Final  . Glucose, Urinalysis 01/30/2023 Negative  Negative mg/dL Final  . Ketones, Urinalysis 01/30/2023 Negative  Negative mg/dL Final  . Blood, Urinalysis 01/30/2023 Negative  Negative Final  . Nitrite, Urinalysis 01/30/2023 Negative  Negative Final  . Leukocyte Esterase, Urinalysis 01/30/2023 Negative  Negative Final  . Bilirubin, Urinalysis 01/30/2023 Negative  Negative Final  . Urobilinogen, Urinalysis 01/30/2023 0.2  0.2 - 1.0 mg/dL Final  . WBC, UA 91/87/7975 0  <=5 /hpf Final  . Red Blood Cells, Urinalysis 01/30/2023 1  <=3 /hpf Final  . Bacteria, Urinalysis 01/30/2023 0-5  0 - 5 /hpf Final  . Squamous Epithelial Cells, Urinaly* 01/30/2023 0  /hpf Final  . Ferritin 01/30/2023 13 (L)  23 - 336 ng/mL Final  . Vitamin D, 25-Hydroxy - [facility] 01/30/2023 48.8  30.0 - 100.0 ng/mL Final   Vitamin D deficiency has been defined by the Institute of Medicine and an Endocrine Society practice guideline as a level of serum 25-OH vitamin D less than 20 ng/mL (1,2). The Endocrine Society went on to further define vitamin D insufficiency as a level between 21 and 29 ng/mL (2). 1. IOM (Institute of Medicine). 2010. Dietary reference    intakes for calcium and D. [place]: The    [employer]. 2. Holick MF, Binkley [place], Bischoff-Ferrari HA, et al.    Evaluation, treatment, and prevention of vitamin D    deficiency: an Endocrine Society clinical practice    guideline. JCEM. 2011 Jul; 96(7):1911-30.    Exam   Blood pressure 122/80, pulse 74, height 181.6 cm (5' 11.5), weight 79.4 kg (175 lb), SpO2 97%.  Wt Readings from Last 3 Encounters:  02/02/23 79.4 kg (175 lb)  07/15/22 79.8 kg (176 lb)  04/13/22 79.8 kg (176 lb)   NAD General. Alert oriented x3  Skin. No suspicious lesions or moles.   Eyes. Sclera and conjunctiva clear; pupils equal round and reactive to light extraocular movements intact Ears. External normal; canals  clear; tympanic membranes normal Nose. Mucosa healthy without drainage or ulceration Oropharynx. Congested  Neck. No swelling, masses, stiffness, pain, limited movement, carotid pulses normal bilaterally, thyroid normal size, no masses palpated.  No bruits Lungs. Respirations unlabored; CTA  Back. No spinal deformity Cardiovascular. Heart regular rate and rhythm without murmurs, gallops, or rubs Abdomen. Soft;Non tender  non distended; normoactive bowel sounds; no masses or organomegaly RECTAL: Declined  Lymph Nodes. No significant cervical, supraclavicular, axillary or inguinal lymphadenopathy noted Musculoskeletal. No deformities; no active joint inflammation Extremities. Normal, no edema Neurologic. Alert and oriented; speech intact; face symmetrical; moves all extremities well   Assessment and Plan    1 Major Depression in remission  On Citalopram 40 mg po qd  2 GERD: On Nexium  3 Anemia : Hgb hs improved to 12.8 Rec. Niferex 150 mg po qd- Take with Vit C  Refer GI  4 Headache disorder; Advised to come off Goody Powder 5 Health Maintenance; Up to date with Flu shot  and COVID Vaccine  Discussed results of labs  Labs 1 week prior to next visit  Follow up in 6 months   Tamra Leventhal  MD

## 2024-01-06 ENCOUNTER — Emergency Department (HOSPITAL_COMMUNITY): Payer: Self-pay

## 2024-01-06 ENCOUNTER — Other Ambulatory Visit: Payer: Self-pay

## 2024-01-06 ENCOUNTER — Encounter (HOSPITAL_COMMUNITY): Payer: Self-pay | Admitting: Emergency Medicine

## 2024-01-06 ENCOUNTER — Emergency Department (HOSPITAL_COMMUNITY)
Admission: EM | Admit: 2024-01-06 | Discharge: 2024-01-07 | Disposition: A | Discharge status: Home / Self Care | Payer: Self-pay | Attending: Emergency Medicine | Admitting: Emergency Medicine

## 2024-01-06 DIAGNOSIS — E876 Hypokalemia: Secondary | ICD-10-CM | POA: Diagnosis not present

## 2024-01-06 DIAGNOSIS — R079 Chest pain, unspecified: Secondary | ICD-10-CM | POA: Diagnosis present

## 2024-01-06 DIAGNOSIS — Y906 Blood alcohol level of 120-199 mg/100 ml: Secondary | ICD-10-CM | POA: Diagnosis not present

## 2024-01-06 DIAGNOSIS — R4182 Altered mental status, unspecified: Secondary | ICD-10-CM | POA: Insufficient documentation

## 2024-01-06 DIAGNOSIS — K219 Gastro-esophageal reflux disease without esophagitis: Secondary | ICD-10-CM | POA: Diagnosis not present

## 2024-01-06 DIAGNOSIS — R464 Slowness and poor responsiveness: Secondary | ICD-10-CM | POA: Diagnosis not present

## 2024-01-06 DIAGNOSIS — R4689 Other symptoms and signs involving appearance and behavior: Secondary | ICD-10-CM | POA: Diagnosis not present

## 2024-01-06 DIAGNOSIS — R072 Precordial pain: Secondary | ICD-10-CM

## 2024-01-06 DIAGNOSIS — F10920 Alcohol use, unspecified with intoxication, uncomplicated: Secondary | ICD-10-CM

## 2024-01-06 LAB — CBG MONITORING, ED: Blood, UA: 90

## 2024-01-06 MED ORDER — ONDANSETRON HCL 4 MG/2ML IJ SOLN
4.0000 mg | Freq: Once | INTRAMUSCULAR | Status: AC
  Administered 2024-01-07: 4 mg via INTRAVENOUS
  Filled 2024-01-06: qty 2

## 2024-01-06 MED ORDER — FAMOTIDINE IN NACL 20-0.9 MG/50ML-% IV SOLN
20.0000 mg | Freq: Once | INTRAVENOUS | Status: AC
  Administered 2024-01-07: 20 mg via INTRAVENOUS
  Filled 2024-01-06: qty 50

## 2024-01-06 MED ORDER — SODIUM CHLORIDE 0.9% FLUSH: 3.0000 mL | Freq: Once | INTRAVENOUS | Status: DC

## 2024-01-06 NOTE — ED Notes (Signed)
 Called CCMD at [phone] to initiate cardiac monitoring as ordered.

## 2024-01-06 NOTE — ED Triage Notes (Signed)
 Pt via [facility] from [place] [employer] [place] where he and his wife were eating pizza when he went unresponsive and had what seemed like a choking event. Wife was unable to get pt to respond and started CPR. After EMS arrival on scene, he had multiple episodes during which he would not respond to sternal rub and was not moving his right side, so they activated code stroke PTA. At time of arrival, neuro symptoms have resolved. Pt is a/o x 4, GCS 15, moving all extremities and NIHSS score 0. Wife reported that pt had consumed 7 beers before this started. Pt reports left-sided chest pain and severe pain to left ribs. Selectively answering questions during triage assessment.

## 2024-01-06 NOTE — ED Notes (Signed)
CBG:90 

## 2024-01-07 ENCOUNTER — Emergency Department (HOSPITAL_COMMUNITY)

## 2024-01-07 DIAGNOSIS — R464 Slowness and poor responsiveness: Secondary | ICD-10-CM

## 2024-01-07 DIAGNOSIS — R4689 Other symptoms and signs involving appearance and behavior: Secondary | ICD-10-CM

## 2024-01-07 LAB — ETHANOL: Alcohol, Ethyl (B): 191 mg/dL — ABNORMAL HIGH (ref <15)

## 2024-01-07 LAB — CBC WITH DIFFERENTIAL/PLATELET
Abs Immature Granulocytes: 0.02 K/uL (ref 0.00–0.07)
Basophils Absolute: 0 K/uL (ref 0.0–0.1)
Basophils Relative: 1 %
Eosinophils Absolute: 0.3 K/uL (ref 0.0–0.5)
Eosinophils Relative: 6 %
HCT: 36.3 % — ABNORMAL LOW (ref 39.0–52.0)
Hemoglobin: 11.6 g/dL — ABNORMAL LOW (ref 13.0–17.0)
Immature Granulocytes: 0 %
Lymphocytes Relative: 26 %
Lymphs Abs: 1.5 K/uL (ref 0.7–4.0)
MCH: 28.9 pg (ref 26.0–34.0)
MCHC: 32 g/dL (ref 30.0–36.0)
MCV: 90.5 fL (ref 80.0–100.0)
Monocytes Absolute: 0.4 K/uL (ref 0.1–1.0)
Monocytes Relative: 7 %
Neutro Abs: 3.5 K/uL (ref 1.7–7.7)
Neutrophils Relative %: 60 %
Platelets: 227 K/uL (ref 150–400)
RBC: 4.01 MIL/uL — ABNORMAL LOW (ref 4.22–5.81)
RDW: 13.6 % (ref 11.5–15.5)
WBC: 5.7 K/uL (ref 4.0–10.5)
nRBC: 0 % (ref 0.0–0.2)

## 2024-01-07 LAB — RAPID URINE DRUG SCREEN, HOSP PERFORMED
Amphetamines: NOT DETECTED
Barbiturates: NOT DETECTED
Benzodiazepines: POSITIVE — AB
Cocaine: NOT DETECTED
Opiates: NOT DETECTED
Tetrahydrocannabinol: NOT DETECTED

## 2024-01-07 LAB — I-STAT CHEM 8, ED
BUN: 9 mg/dL (ref 6–20)
Calcium, Ion: 1.06 mmol/L — ABNORMAL LOW (ref 1.15–1.40)
Chloride: 105 mmol/L (ref 98–111)
Creatinine, Ser: 1.1 mg/dL (ref 0.61–1.24)
Glucose, Bld: 63 mg/dL — ABNORMAL LOW (ref 70–99)
HCT: 32 % — ABNORMAL LOW (ref 39.0–52.0)
Hemoglobin: 10.9 g/dL — ABNORMAL LOW (ref 13.0–17.0)
Potassium: 2.9 mmol/L — ABNORMAL LOW (ref 3.5–5.1)
Sodium: 139 mmol/L (ref 135–145)
TCO2: 20 mmol/L — ABNORMAL LOW (ref 22–32)

## 2024-01-07 LAB — TROPONIN I (HIGH SENSITIVITY)
Troponin I (High Sensitivity): 2 ng/L (ref <18)
Troponin I (High Sensitivity): 3 ng/L (ref <18)

## 2024-01-07 MED ORDER — POTASSIUM CHLORIDE CRYS ER 20 MEQ PO TBCR
20.0000 meq | EXTENDED_RELEASE_TABLET | Freq: Two times a day (BID) | ORAL | 0 refills | Status: AC
Start: 2024-01-07 — End: ?

## 2024-01-07 MED ORDER — IOHEXOL 350 MG/ML SOLN
75.0000 mL | Freq: Once | INTRAVENOUS | Status: AC | PRN
  Administered 2024-01-07: 75 mL via INTRAVENOUS

## 2024-01-07 MED ORDER — KETOROLAC TROMETHAMINE 30 MG/ML IJ SOLN
30.0000 mg | Freq: Once | INTRAMUSCULAR | Status: AC
  Administered 2024-01-07: 30 mg via INTRAVENOUS
  Filled 2024-01-07: qty 1

## 2024-01-07 MED ORDER — FAMOTIDINE 20 MG PO TABS: 20.0000 mg | ORAL_TABLET | Freq: Two times a day (BID) | ORAL | 0 refills | Status: DC

## 2024-01-07 MED ORDER — ACETAMINOPHEN 500 MG PO TABS
1000.0000 mg | ORAL_TABLET | Freq: Once | ORAL | Status: AC
  Administered 2024-01-07: 1000 mg via ORAL
  Filled 2024-01-07: qty 2

## 2024-01-07 MED ORDER — POTASSIUM CHLORIDE CRYS ER 20 MEQ PO TBCR
60.0000 meq | EXTENDED_RELEASE_TABLET | Freq: Once | ORAL | Status: AC
  Administered 2024-01-07: 60 meq via ORAL
  Filled 2024-01-07: qty 3

## 2024-01-07 MED ORDER — FAMOTIDINE 20 MG PO TABS: 20.0000 mg | ORAL_TABLET | Freq: Two times a day (BID) | ORAL | 0 refills | Status: AC

## 2024-01-07 MED ORDER — MAGNESIUM SULFATE 2 GM/50ML IV SOLN
2.0000 g | Freq: Once | INTRAVENOUS | Status: AC
  Administered 2024-01-07: 2 g via INTRAVENOUS
  Filled 2024-01-07: qty 50

## 2024-01-07 NOTE — Consult Note (Signed)
 NEUROLOGY CONSULT NOTE   Date of service: January 07, 2024 Patient Name: Jose Schmidt MRN:  [id number] DOB:  Jan 26, 1976 Chief Complaint: stroke code, going in and out of consciousness Requesting Provider: Nettie Earing, MD  History of Present Illness  Jose Schmidt is a 48 y.o. male with hx of GERD, nephrolithiasis, sleep apnea, former smoker who is brought in by EMS with episodes of unresponsiveness and choking while eating pizza.  Patient had about 7 beers tonight.  Was eating pizza, appears to have gone unresponsive.  Wife attempted CPR.  EMS arrived and they were worried the patient was not moving his right side.  He was activated as a code stroke.  EMS gave him 5 mg of Versed for concern for nystagmus.  On arrival, patient is opening eyes and making eye contact.  Arms fall to the side when held up above his head.  However, patient smiling and looking around without any issues.  CT head without contrast was obtained which is negative for acute intracranial normalities.  Upon reevaluation after CT head, patient is talking full sentences with no deficit at all and NIH stroke scale is 0.  Patient is able to recall the episode today.  He has been reporting some left-sided chest pain and tells me the pain is worse when he breathes in and out.  He feels slightly short of breath.  He denies any tenderness to palpation on his chest.   LKW: 2200 Modified rankin score: 0-Completely asymptomatic and back to baseline post- stroke IV Thrombolysis: Not offered, no neuro deficit noted.   EVT: Not offered, no neuro deficit noted.   NIHSS components Score: Comment  1a Level of Conscious 0[]  1[]  2[]  3[]      1b LOC Questions 0[]  1[]  2[]       1c LOC Commands 0[]  1[]  2[]       2 Best Gaze 0[]  1[]  2[]       3 Visual 0[]  1[]  2[]  3[]      4 Facial Palsy 0[]  1[]  2[]  3[]      5a Motor Arm - left 0[]  1[]  2[]  3[]  4[]  UN[]    5b Motor Arm - Right 0[]  1[]  2[]  3[]  4[]  UN[]    6a Motor Leg - Left 0[]  1[]   2[]  3[]  4[]  UN[]    6b Motor Leg - Right 0[]  1[]  2[]  3[]  4[]  UN[]    7 Limb Ataxia 0[]  1[]  2[]  UN[]      8 Sensory 0[]  1[]  2[]  UN[]      9 Best Language 0[]  1[]  2[]  3[]      10 Dysarthria 0[]  1[]  2[]  UN[]      11 Extinct. and Inattention 0[]  1[]  2[]       TOTAL: 0      ROS  Comprehensive ROS performed and pertinent positives documented in HPI   Past History   Past Medical History:  Diagnosis Date   GERD (gastroesophageal reflux disease)    Kidney stones    Pneumonia    Sleep apnea     Past Surgical History:  Procedure Laterality Date   INGUINAL HERNIA REPAIR     Bilateral    Family History: Family History  Problem Relation Age of Onset   Heart attack Father    Deep vein thrombosis Father    Hyperlipidemia Father    Hypertension Father    Heart attack Maternal Grandfather    Lung cancer Paternal Grandfather        ? if he smoked or not   Hyperlipidemia Mother  Hypertension Mother    Hypertension Maternal Grandmother     Social History  reports that he quit smoking about 11 years ago. His smoking use included cigarettes. He started smoking about 31 years ago. He has a 10 pack-year smoking history. He has quit using smokeless tobacco. He reports current alcohol use of about 6.0 standard drinks of alcohol per week. He reports that he does not use drugs.  No Known Allergies  Medications   Current Facility-Administered Medications:    famotidine  (PEPCID ) IVPB 20 mg premix, 20 mg, Intravenous, Once, Palumbo, April, MD, Last Rate: 100 mL/hr at 01/07/24 0005, 20 mg at 01/07/24 0005   sodium chloride  flush (NS) 0.9 % injection 3 mL, 3 mL, Intravenous, Once, Palumbo, April, MD  Current Outpatient Medications:    pantoprazole  (PROTONIX ) 40 MG tablet, Take 1 tablet (40 mg total) by mouth 2 (two) times daily., Disp: 60 tablet, Rfl: 3   ranitidine  (ZANTAC ) 150 MG tablet, Take 150 mg by mouth daily., Disp: , Rfl:    sucralfate  (CARAFATE ) 1 GM/10ML suspension, Take 10 mLs (1 g  total) by mouth 2 (two) times daily. As needed for breakthrough heartburn, Disp: 420 mL, Rfl: 1  Vitals   Vitals:   01/06/24 2314 01/06/24 2328 01/06/24 2329  BP:  117/88   Pulse:  78   Resp:  17   Temp:  97.8 F (36.6 C)   TempSrc:  Oral   SpO2:  99%   Weight: 81 kg  81.6 kg  Height:   6' (1.829 m)    Body mass index is 24.41 kg/m.   Physical Exam   General: Laying comfortably in bed; in no acute distress.  HENT: Normal oropharynx and mucosa. Normal external appearance of ears and nose.  Neck: Supple, no pain or tenderness  CV: No JVD. No peripheral edema.  Pulmonary: Symmetric Chest rise. Normal respiratory effort.  Abdomen: Soft to touch, non-tender.  Ext: No cyanosis, edema, or deformity  Skin: No rash. Normal palpation of skin.   Musculoskeletal: Normal digits and nails by inspection. No clubbing.   Neurologic Examination  Mental status/Cognition: Alert, oriented to self, place, month and year, good attention.  Speech/language: Fluent, comprehension intact, object naming intact, repetition intact.  Cranial nerves:   CN II Pupils equal and reactive to light, no VF deficits    CN III,IV,VI EOM intact, no gaze preference or deviation, no nystagmus    CN V normal sensation in V1, V2, and V3 segments bilaterally   CN VII no asymmetry, no nasolabial fold flattening    CN VIII normal hearing to speech    CN IX & X normal palatal elevation, no uvular deviation    CN XI 5/5 head turn and 5/5 shoulder shrug bilaterally    CN XII midline tongue protrusion    Motor:  Muscle bulk: normal, tone normal, pronator drift none tremor none Mvmt Root Nerve  Muscle Right Left Comments  SA C5/6 Ax Deltoid 5 5   EF C5/6 Mc Biceps 5 5   EE C6/7/8 Rad Triceps 5 5   WF C6/7 Med FCR     WE C7/8 PIN ECU     F Ab C8/T1 U ADM/FDI 5 5   HF L1/2/3 Fem Illopsoas 5 5   KE L2/3/4 Fem Quad 5 5   DF L4/5 D Peron Tib Ant 5 5   PF S1/2 Tibial Grc/Sol 5 5    Sensation:  Light touch Intact  throughout   Pin prick  Temperature    Vibration   Proprioception    Coordination/Complex Motor:  - Finger to Nose intact bilaterally - Heel to shin intact bilaterally - Rapid alternating movement are normal - Gait: Deferred for patient safety.  Labs/Imaging/Neurodiagnostic studies   CBC:  Recent Labs  Lab January 14, 2024 0000  HGB 10.9*  HCT 32.0*   Basic Metabolic Panel:  Lab Results  Component Value Date   NA 139 14-Jan-2024   K 2.9 (L) Jan 14, 2024   CO2 29 11/04/2016   GLUCOSE 63 (L) Jan 14, 2024   BUN 9 01/14/2024   CREATININE 1.10 Jan 14, 2024   CALCIUM 9.4 11/04/2016   GFRNONAA 86 (L) 04/23/2013   GFRAA >90 04/23/2013   Lipid Panel:  Lab Results  Component Value Date   LDLCALC 90 11/04/2016   HgbA1c: No results found for: HGBA1C Urine Drug Screen: No results found for: LABOPIA, COCAINSCRNUR, LABBENZ, AMPHETMU, THCU, LABBARB  Alcohol Level No results found for: ETH INR No results found for: INR APTT No results found for: APTT AED levels: No results found for: PHENYTOIN, ZONISAMIDE, LAMOTRIGINE, LEVETIRACETA  CT Head without contrast(Personally reviewed): CTH was negative for a large hypodensity concerning for a large territory infarct or hyperdensity concerning for an ICH  ASSESSMENT   Jose Schmidt is a 48 y.o. male with hx of GERD, nephrolithiasis, sleep apnea, former smoker who is brought in by EMS with episodes of unresponsiveness and choking while eating pizza.  Was not responding when I saw him at the bridge but noted to be smiling, looking around ith good eye contact. Arms fell to the side when held up above his head, preserved BL blink to threat.  The episode noted seemed behavioral in nature on my evaluation. After the CT head, on re-evaluation, he was able to provide full history and participate in exam with no neuro deficit.  RECOMMENDATIONS  - MRI [place] if these episodes are recurring and continuing. - CBC,  chemistry, EtOh levels, UDS. - rEEG outpatient. Can do inpatient if the episodes are recurring. - follow up with neurology outpatient. - low suspicion for stroke. ______________________________________________________________________  Plan discussed with patient and with Dr. Palumbo. Dr. Nettie to evaluate his chest pain.  Signed, Anden Bartolo, MD [facility]

## 2024-01-07 NOTE — ED Notes (Signed)
 Pt is requesting pain meds. Notified EDP

## 2024-01-07 NOTE — ED Notes (Signed)
 Patient transported to CT

## 2024-01-07 NOTE — ED Notes (Signed)
 Pt had an episode while his wife and mother were at bedside; he went unresponsive for a minute and lay limp on the stretcher. Vitals unchanged from prior established baseline. This RN entered the room and stated that more blood was needed for lab work, and pt opened his eyes and maintained eye contact with RN throughout blood draw procedure, unblinking, while family continued to attempt to get his attention. When RN completed blood draw, pt closed his eyes. Vitals remained unchanged. RN notified Dr Nettie of pt's presentation. No new orders received. Pt is now interacting normally.

## 2024-01-07 NOTE — ED Provider Notes (Signed)
 [facility] Provider Note   CSN: [id number] Arrival date & time: 01/06/24  2311     Patient presents with: Altered Mental Status   Jose Schmidt is a 48 y.o. male.   The history is provided by the EMS personnel, a relative and medical records. The history is limited by the condition of the patient.  Altered Mental Status Presenting symptoms comment:  Chocking episode and could not speak with it  Severity:  Mild Most recent episode:  Today Episode history:  Single Timing:  Constant Progression:  Resolved Chronicity:  New Context: not recent illness and not recent infection   Associated symptoms: no abdominal pain and no fever   Patient with GERD had a choking episode and burning in throat.  Also 2 weeks of left sided chest pain worse with inspiration.  Had alcohol this evening.  Was not responding with chocking and EMS called.      Past Medical History:  Diagnosis Date   GERD (gastroesophageal reflux disease)    Kidney stones    Pneumonia    Sleep apnea      Prior to Admission medications   Medication Sig Start Date End Date Taking? Authorizing Provider  potassium chloride  SA (KLOR-CON  M) 20 MEQ tablet Take 1 tablet (20 mEq total) by mouth 2 (two) times daily. 01/07/24  Yes Monta Police, MD  famotidine  (PEPCID ) 20 MG tablet Take 1 tablet (20 mg total) by mouth 2 (two) times daily. 01/07/24   Christipher Rieger, MD  pantoprazole  (PROTONIX ) 40 MG tablet Take 1 tablet (40 mg total) by mouth 2 (two) times daily. 03/05/18   Legrand Victory LITTIE DOUGLAS, MD  ranitidine  (ZANTAC ) 150 MG tablet Take 150 mg by mouth daily.    [provider]  sucralfate  (CARAFATE ) 1 GM/10ML suspension Take 10 mLs (1 g total) by mouth 2 (two) times daily. As needed for breakthrough heartburn 02/15/18   Legrand Victory LITTIE DOUGLAS, MD    Allergies: Patient has no known allergies.    Review of Systems  Constitutional:  Negative for fever.  Eyes:  Negative for  photophobia.  Respiratory:  Negative for wheezing and stridor.   Gastrointestinal:  Negative for abdominal pain.  All other systems reviewed and are negative.   Updated Vital Signs BP 107/79   Pulse 71   Temp 97.8 F (36.6 C) (Oral)   Resp 19   Ht 6' (1.829 m)   Wt 81.6 kg   SpO2 99%   BMI 24.41 kg/m   Physical Exam Vitals and nursing note reviewed.  Constitutional:      General: He is not in acute distress.    Appearance: Normal appearance. He is well-developed. He is not diaphoretic.  HENT:     Head: Normocephalic and atraumatic.     Nose: Nose normal.     Mouth/Throat:     Mouth: Mucous membranes are moist.     Pharynx: Oropharynx is clear.  Eyes:     Extraocular Movements: Extraocular movements intact.     Conjunctiva/sclera: Conjunctivae normal.     Pupils: Pupils are equal, round, and reactive to light.  Cardiovascular:     Rate and Rhythm: Normal rate and regular rhythm.     Pulses: Normal pulses.     Heart sounds: Normal heart sounds.  Pulmonary:     Effort: Pulmonary effort is normal.     Breath sounds: Normal breath sounds. No wheezing or rales.  Abdominal:  General: Bowel sounds are normal.     Palpations: Abdomen is soft.     Tenderness: There is no abdominal tenderness. There is no guarding or rebound.  Musculoskeletal:        General: Normal range of motion.     Cervical back: Normal range of motion and neck supple.  Skin:    General: Skin is warm and dry.     Capillary Refill: Capillary refill takes less than 2 seconds.  Neurological:     General: No focal deficit present.     Mental Status: He is alert and oriented to person, place, and time.     Deep Tendon Reflexes: Reflexes normal.  Psychiatric:        Mood and Affect: Mood normal.        Behavior: Behavior normal.     (all labs ordered are listed, but only abnormal results are displayed) Results for orders placed or performed during the hospital encounter of 01/06/24  Rapid urine  drug screen (hospital performed)   Collection Time: 01/06/24  1:00 AM  Result Value Ref Range   Opiates NONE DETECTED NONE DETECTED   Cocaine NONE DETECTED NONE DETECTED   Benzodiazepines POSITIVE (A) NONE DETECTED   Amphetamines NONE DETECTED NONE DETECTED   Tetrahydrocannabinol NONE DETECTED NONE DETECTED   Barbiturates NONE DETECTED NONE DETECTED  CBG monitoring, ED   Collection Time: 01/06/24 11:23 PM  Result Value Ref Range   Blood, UA 90   Troponin I (High Sensitivity)   Collection Time: 01/06/24 11:55 PM  Result Value Ref Range   Troponin I (High Sensitivity) 2 <18 ng/L  Ethanol   Collection Time: 01/06/24 11:59 PM  Result Value Ref Range   Alcohol, Ethyl (B) 191 (H) <15 mg/dL  I-stat chem 8, ED   Collection Time: 01/07/24 12:00 AM  Result Value Ref Range   Sodium 139 135 - 145 mmol/L   Potassium 2.9 (L) 3.5 - 5.1 mmol/L   Chloride 105 98 - 111 mmol/L   BUN 9 6 - 20 mg/dL   Creatinine, Ser 8.89 0.61 - 1.24 mg/dL   Glucose, Bld 63 (L) 70 - 99 mg/dL   Calcium, Ion 8.93 (L) 1.15 - 1.40 mmol/L   TCO2 20 (L) 22 - 32 mmol/L   Hemoglobin 10.9 (L) 13.0 - 17.0 g/dL   HCT 67.9 (L) 60.9 - 47.9 %  Troponin I (High Sensitivity)   Collection Time: 01/07/24  2:01 AM  Result Value Ref Range   Troponin I (High Sensitivity) 3 <18 ng/L  CBC with Differential   Collection Time: 01/07/24  3:44 AM  Result Value Ref Range   WBC 5.7 4.0 - 10.5 K/uL   RBC 4.01 (L) 4.22 - 5.81 MIL/uL   Hemoglobin 11.6 (L) 13.0 - 17.0 g/dL   HCT 63.6 (L) 60.9 - 47.9 %   MCV 90.5 80.0 - 100.0 fL   MCH 28.9 26.0 - 34.0 pg   MCHC 32.0 30.0 - 36.0 g/dL   RDW 86.3 88.4 - 84.4 %   Platelets 227 150 - 400 K/uL   nRBC 0.0 0.0 - 0.2 %   Neutrophils Relative % 60 %   Neutro Abs 3.5 1.7 - 7.7 K/uL   Lymphocytes Relative 26 %   Lymphs Abs 1.5 0.7 - 4.0 K/uL   Monocytes Relative 7 %   Monocytes Absolute 0.4 0.1 - 1.0 K/uL   Eosinophils Relative 6 %   Eosinophils Absolute 0.3 0.0 - 0.5 K/uL   Basophils  Relative  1 %   Basophils Absolute 0.0 0.0 - 0.1 K/uL   Immature Granulocytes 0 %   Abs Immature Granulocytes 0.02 0.00 - 0.07 K/uL   CT Angio Chest PE W and/or Wo Contrast Result Date: 01/07/2024 EXAM: CTA of the Chest with contrast for PE 01/07/2024 01:51:08 AM TECHNIQUE: CTA of the chest was performed with the administration of 75 mL of intravenous iohexol  (OMNIPAQUE ) 350 MG/ML injection. Multiplanar reformatted images are provided for review. MIP images are provided for review. Automated exposure control, iterative reconstruction, and/or weight based adjustment of the mA/kV was utilized to reduce the radiation dose to as low as reasonably achievable. COMPARISON: 12/07/2016 CLINICAL HISTORY: Syncope/presyncope, cerebrovascular cause suspected. Chief complaints; Altered Mental Status; CT Angio Chest PE W and/or Wo Contrast; Syncope/presyncope, cerebrovascular cause suspected. FINDINGS: PULMONARY ARTERIES: Pulmonary arteries are adequately opacified for evaluation. No evidence of pulmonary embolism. Main pulmonary artery is normal in caliber. MEDIASTINUM: The heart and pericardium demonstrate no acute abnormality. There is no acute abnormality of the thoracic aorta. LYMPH NODES: No mediastinal, hilar or axillary lymphadenopathy. LUNGS AND PLEURA: Mild paraseptal emphysematous changes in the bilateral upper lobes. Mild dependent atelectasis in the bilateral lower lobes. No focal consolidation or pulmonary edema. No pleural effusion or pneumothorax. UPPER ABDOMEN: Small hiatal hernia. SOFT TISSUES AND BONES: No acute bone or soft tissue abnormality. IMPRESSION: 1. No evidence of pulmonary embolism. Electronically signed by: Pinkie Pebbles MD 01/07/2024 01:54 AM EDT RP Workstation: [id number]   CT HEAD CODE STROKE WO CONTRAST Result Date: 01/06/2024 CLINICAL DATA:  Code stroke.  Acute neurologic deficit EXAM: CT HEAD WITHOUT CONTRAST TECHNIQUE: Contiguous axial images were obtained from the base of the skull  through the vertex without intravenous contrast. RADIATION DOSE REDUCTION: This exam was performed according to the departmental dose-optimization program which includes automated exposure control, adjustment of the mA and/or kV according to patient size and/or use of iterative reconstruction technique. COMPARISON:  None Available. FINDINGS: Brain: There is no mass, hemorrhage or extra-axial collection. The size and configuration of the ventricles and extra-axial CSF spaces are normal. The brain parenchyma is normal, without evidence of acute or chronic infarction. Vascular: No abnormal hyperdensity of the major intracranial arteries or dural venous sinuses. No intracranial atherosclerosis. Skull: The visualized skull base, calvarium and extracranial soft tissues are normal. Sinuses/Orbits: No fluid levels or advanced mucosal thickening of the visualized paranasal sinuses. No mastoid or middle ear effusion. The orbits are normal. ASPECTS [facility] Stroke Program Early CT Score) - Ganglionic level infarction (caudate, lentiform nuclei, internal capsule, insula, M1-M3 cortex): 7 - Supraganglionic infarction (M4-M6 cortex): 3 Total score (0-10 with 10 being normal): 10 IMPRESSION: 1. No acute intracranial abnormality. 2. ASPECTS is 10. These results were communicated to Dr. Salman Khaliqdina at 11:18 pm on 01/06/2024 by text page via the [facility] messaging system. Electronically Signed   By: Franky Stanford M.D.   On: 01/06/2024 23:20    EKG: None  Radiology: CT Angio Chest PE W and/or Wo Contrast Result Date: 01/07/2024 EXAM: CTA of the Chest with contrast for PE 01/07/2024 01:51:08 AM TECHNIQUE: CTA of the chest was performed with the administration of 75 mL of intravenous iohexol  (OMNIPAQUE ) 350 MG/ML injection. Multiplanar reformatted images are provided for review. MIP images are provided for review. Automated exposure control, iterative reconstruction, and/or weight based adjustment of the mA/kV was utilized to  reduce the radiation dose to as low as reasonably achievable. COMPARISON: 12/07/2016 CLINICAL HISTORY: Syncope/presyncope, cerebrovascular cause suspected. Chief complaints; Altered Mental Status; CT  Angio Chest PE W and/or Wo Contrast; Syncope/presyncope, cerebrovascular cause suspected. FINDINGS: PULMONARY ARTERIES: Pulmonary arteries are adequately opacified for evaluation. No evidence of pulmonary embolism. Main pulmonary artery is normal in caliber. MEDIASTINUM: The heart and pericardium demonstrate no acute abnormality. There is no acute abnormality of the thoracic aorta. LYMPH NODES: No mediastinal, hilar or axillary lymphadenopathy. LUNGS AND PLEURA: Mild paraseptal emphysematous changes in the bilateral upper lobes. Mild dependent atelectasis in the bilateral lower lobes. No focal consolidation or pulmonary edema. No pleural effusion or pneumothorax. UPPER ABDOMEN: Small hiatal hernia. SOFT TISSUES AND BONES: No acute bone or soft tissue abnormality. IMPRESSION: 1. No evidence of pulmonary embolism. Electronically signed by: Pinkie Pebbles MD 01/07/2024 01:54 AM EDT RP Workstation: [id number]   CT HEAD CODE STROKE WO CONTRAST Result Date: 01/06/2024 CLINICAL DATA:  Code stroke.  Acute neurologic deficit EXAM: CT HEAD WITHOUT CONTRAST TECHNIQUE: Contiguous axial images were obtained from the base of the skull through the vertex without intravenous contrast. RADIATION DOSE REDUCTION: This exam was performed according to the departmental dose-optimization program which includes automated exposure control, adjustment of the mA and/or kV according to patient size and/or use of iterative reconstruction technique. COMPARISON:  None Available. FINDINGS: Brain: There is no mass, hemorrhage or extra-axial collection. The size and configuration of the ventricles and extra-axial CSF spaces are normal. The brain parenchyma is normal, without evidence of acute or chronic infarction. Vascular: No abnormal  hyperdensity of the major intracranial arteries or dural venous sinuses. No intracranial atherosclerosis. Skull: The visualized skull base, calvarium and extracranial soft tissues are normal. Sinuses/Orbits: No fluid levels or advanced mucosal thickening of the visualized paranasal sinuses. No mastoid or middle ear effusion. The orbits are normal. ASPECTS [facility] Stroke Program Early CT Score) - Ganglionic level infarction (caudate, lentiform nuclei, internal capsule, insula, M1-M3 cortex): 7 - Supraganglionic infarction (M4-M6 cortex): 3 Total score (0-10 with 10 being normal): 10 IMPRESSION: 1. No acute intracranial abnormality. 2. ASPECTS is 10. These results were communicated to Dr. Salman Khaliqdina at 11:18 pm on 01/06/2024 by text page via the [facility] messaging system. Electronically Signed   By: Franky Stanford M.D.   On: 01/06/2024 23:20     Procedures   Medications Ordered in the ED  sodium chloride  flush (NS) 0.9 % injection 3 mL (3 mLs Intravenous Not Given 01/06/24 2331)  famotidine  (PEPCID ) IVPB 20 mg premix (0 mg Intravenous Stopped 01/07/24 0105)  ondansetron  (ZOFRAN ) injection 4 mg (4 mg Intravenous Given 01/07/24 0003)  iohexol  (OMNIPAQUE ) 350 MG/ML injection 75 mL (75 mLs Intravenous Contrast Given 01/07/24 0148)  potassium chloride  SA (KLOR-CON  M) CR tablet 60 mEq (60 mEq Oral Given 01/07/24 0347)  ketorolac  (TORADOL ) 30 MG/ML injection 30 mg (30 mg Intravenous Given 01/07/24 0349)  acetaminophen  (TYLENOL ) tablet 1,000 mg (1,000 mg Oral Given 01/07/24 0347)  magnesium  sulfate IVPB 2 g 50 mL (0 g Intravenous Stopped 01/07/24 0410)                                    Medical Decision Making Patient came in for stroke alert versed given.  Canceled by stroke team   Amount and/or Complexity of Data Reviewed Independent Historian: EMS    Details: See above  External Data Reviewed: notes.    Details: Previous notes reviewed  Labs: ordered.    Details: UDS positive benzos.  Negative  troponin 2/3. Normal sodium low potassium 2.9, normal creatinine  Radiology: ordered and independent interpretation performed.    Details: Negative CT head  ECG/medicine tests: ordered and independent interpretation performed. Decision-making details documented in ED Course.  Risk OTC drugs. Prescription drug management. Risk Details: Stroke alert canceled immediately.  Rule out for Mi and PE in the ED. Heart score is 1 very low risk for MACE.  Pain is likely MSK in nature but treated for GERD.  Also treated for hypokalemia and will be treated for that in the ED and at discharge.      EKG Interpretation Date/Time:  Saturday January 06 2024 23:27:23 EDT Ventricular Rate:  74 PR Interval:  169 QRS Duration:  86 QT Interval:  374 QTC Calculation: 415 R Axis:   53  Text Interpretation: Sinus rhythm Confirmed by Nettie, Rhylen Pulido ([id number]) on 01/07/2024 5:55:41 AM          Final diagnoses:  Hypokalemia  Precordial pain  Gastroesophageal reflux disease, unspecified whether esophagitis present   No signs of systemic illness or infection. The patient is nontoxic-appearing on exam and vital signs are within normal limits.  I have reviewed the triage vital signs and the nursing notes. Pertinent labs & imaging results that were available during my care of the patient were reviewed by me and considered in my medical decision making (see chart for details). After history, exam, and medical workup I feel the patient has been appropriately medically screened and is safe for discharge home. Pertinent diagnoses were discussed with the patient. Patient was given return precautions ED Discharge Orders          Ordered    potassium chloride  SA (KLOR-CON  M) 20 MEQ tablet  2 times daily        01/07/24 0336    famotidine  (PEPCID ) 20 MG tablet  2 times daily,   Status:  Discontinued        01/07/24 0435    famotidine  (PEPCID ) 20 MG tablet  2 times daily        01/07/24 0435               Ijanae Macapagal,  Analeigha Nauman, MD 01/07/24 [id number]

## 2024-01-08 LAB — CBG MONITORING, ED: Glucose-Capillary: 90 mg/dL (ref 70–99)
